# Patient Record
Sex: Female | Born: 1945
Health system: Southern US, Community
[De-identification: ages and names within clinical notes are randomized; demographics above are authoritative.]

## PROBLEM LIST (undated history)

## (undated) DIAGNOSIS — K219 Gastro-esophageal reflux disease without esophagitis: Secondary | ICD-10-CM

## (undated) DIAGNOSIS — F419 Anxiety disorder, unspecified: Secondary | ICD-10-CM

## (undated) HISTORY — PX: COLONOSCOPY: SHX174

## (undated) HISTORY — PX: ESOPHAGOGASTRODUODENOSCOPY: SHX1529

## (undated) HISTORY — PX: TUBAL LIGATION: SHX77

---

## 1998-07-19 ENCOUNTER — Other Ambulatory Visit: Admission: RE | Admit: 1998-07-19 | Discharge: 1998-07-19 | Payer: Self-pay | Admitting: Gastroenterology

## 2008-06-12 ENCOUNTER — Encounter (INDEPENDENT_AMBULATORY_CARE_PROVIDER_SITE_OTHER): Payer: Self-pay | Admitting: *Deleted

## 2009-03-21 ENCOUNTER — Telehealth: Payer: Self-pay | Admitting: Gastroenterology

## 2010-04-08 NOTE — Progress Notes (Signed)
Summary: Schedule Colonoscopy  Phone Note Outgoing Call Call back at [facility] Phone [phone]   Call placed by: Harlow Mares CMA Duncan Dull),  March 21, 2009 1:23 PM Call placed to: Patient Summary of Call: patient has no health insurance and is living off of her SS at this time. So she declined. I gave her the phone number for patient assistance [phone]. Also l gave her our number to call back if her status changes.  Initial call taken by: Harlow Mares CMA (AAMA),  March 21, 2009 1:35 PM

## 2012-01-22 ENCOUNTER — Encounter (INDEPENDENT_AMBULATORY_CARE_PROVIDER_SITE_OTHER): Payer: Self-pay | Admitting: *Deleted

## 2012-06-17 ENCOUNTER — Other Ambulatory Visit (HOSPITAL_COMMUNITY): Payer: Self-pay | Admitting: Internal Medicine

## 2012-06-17 DIAGNOSIS — Z139 Encounter for screening, unspecified: Secondary | ICD-10-CM

## 2012-06-21 ENCOUNTER — Ambulatory Visit (HOSPITAL_COMMUNITY)
Admission: RE | Admit: 2012-06-21 | Discharge: 2012-06-21 | Disposition: A | Discharge status: Home / Self Care | Payer: Medicare Other | Source: Ambulatory Visit | Attending: Internal Medicine | Admitting: Internal Medicine

## 2012-06-21 DIAGNOSIS — Z139 Encounter for screening, unspecified: Secondary | ICD-10-CM

## 2012-06-21 DIAGNOSIS — Z1231 Encounter for screening mammogram for malignant neoplasm of breast: Secondary | ICD-10-CM | POA: Insufficient documentation

## 2013-04-20 ENCOUNTER — Encounter (INDEPENDENT_AMBULATORY_CARE_PROVIDER_SITE_OTHER): Payer: Self-pay | Admitting: *Deleted

## 2013-08-22 ENCOUNTER — Other Ambulatory Visit (HOSPITAL_COMMUNITY): Payer: Self-pay | Admitting: Internal Medicine

## 2013-08-22 DIAGNOSIS — Z1231 Encounter for screening mammogram for malignant neoplasm of breast: Secondary | ICD-10-CM

## 2013-08-29 ENCOUNTER — Ambulatory Visit (HOSPITAL_COMMUNITY): Payer: Medicare Other

## 2013-08-31 ENCOUNTER — Ambulatory Visit (HOSPITAL_COMMUNITY)
Admission: RE | Admit: 2013-08-31 | Discharge: 2013-08-31 | Disposition: A | Discharge status: Home / Self Care | Payer: Medicare HMO | Source: Ambulatory Visit | Attending: Internal Medicine | Admitting: Internal Medicine

## 2013-08-31 DIAGNOSIS — Z1231 Encounter for screening mammogram for malignant neoplasm of breast: Secondary | ICD-10-CM | POA: Insufficient documentation

## 2014-10-09 DIAGNOSIS — I10 Essential (primary) hypertension: Secondary | ICD-10-CM | POA: Diagnosis not present

## 2014-10-09 DIAGNOSIS — Z0000 Encounter for general adult medical examination without abnormal findings: Secondary | ICD-10-CM | POA: Diagnosis not present

## 2014-10-09 DIAGNOSIS — E782 Mixed hyperlipidemia: Secondary | ICD-10-CM | POA: Diagnosis not present

## 2014-11-03 DIAGNOSIS — Z0000 Encounter for general adult medical examination without abnormal findings: Secondary | ICD-10-CM | POA: Diagnosis not present

## 2014-11-16 ENCOUNTER — Other Ambulatory Visit (HOSPITAL_COMMUNITY): Payer: Self-pay | Admitting: Internal Medicine

## 2014-11-16 DIAGNOSIS — Z1231 Encounter for screening mammogram for malignant neoplasm of breast: Secondary | ICD-10-CM

## 2014-11-21 ENCOUNTER — Other Ambulatory Visit (HOSPITAL_COMMUNITY): Payer: Self-pay | Admitting: Internal Medicine

## 2014-11-21 DIAGNOSIS — M8580 Other specified disorders of bone density and structure, unspecified site: Secondary | ICD-10-CM

## 2014-12-05 ENCOUNTER — Other Ambulatory Visit (HOSPITAL_COMMUNITY): Payer: Medicare HMO

## 2014-12-05 ENCOUNTER — Ambulatory Visit (HOSPITAL_COMMUNITY): Payer: Medicare HMO

## 2014-12-19 ENCOUNTER — Other Ambulatory Visit (HOSPITAL_COMMUNITY): Payer: Medicare HMO

## 2014-12-19 ENCOUNTER — Ambulatory Visit (HOSPITAL_COMMUNITY)
Admission: RE | Admit: 2014-12-19 | Discharge: 2014-12-19 | Disposition: A | Discharge status: Home / Self Care | Payer: Commercial Managed Care - HMO | Source: Ambulatory Visit | Attending: Internal Medicine | Admitting: Internal Medicine

## 2014-12-19 DIAGNOSIS — Z1231 Encounter for screening mammogram for malignant neoplasm of breast: Secondary | ICD-10-CM

## 2014-12-26 ENCOUNTER — Other Ambulatory Visit (HOSPITAL_COMMUNITY): Payer: Self-pay | Admitting: Internal Medicine

## 2014-12-26 ENCOUNTER — Ambulatory Visit (HOSPITAL_COMMUNITY)
Admission: RE | Admit: 2014-12-26 | Discharge: 2014-12-26 | Disposition: A | Discharge status: Home / Self Care | Payer: Commercial Managed Care - HMO | Source: Ambulatory Visit | Attending: Internal Medicine | Admitting: Internal Medicine

## 2014-12-26 DIAGNOSIS — Z780 Asymptomatic menopausal state: Secondary | ICD-10-CM | POA: Insufficient documentation

## 2014-12-26 DIAGNOSIS — M899 Disorder of bone, unspecified: Secondary | ICD-10-CM | POA: Insufficient documentation

## 2014-12-26 DIAGNOSIS — M8580 Other specified disorders of bone density and structure, unspecified site: Secondary | ICD-10-CM

## 2014-12-26 DIAGNOSIS — M810 Age-related osteoporosis without current pathological fracture: Secondary | ICD-10-CM | POA: Insufficient documentation

## 2015-01-29 DIAGNOSIS — F411 Generalized anxiety disorder: Secondary | ICD-10-CM | POA: Diagnosis not present

## 2015-01-29 DIAGNOSIS — C4441 Basal cell carcinoma of skin of scalp and neck: Secondary | ICD-10-CM | POA: Diagnosis not present

## 2015-02-13 DIAGNOSIS — Z85828 Personal history of other malignant neoplasm of skin: Secondary | ICD-10-CM | POA: Diagnosis not present

## 2015-02-13 DIAGNOSIS — D485 Neoplasm of uncertain behavior of skin: Secondary | ICD-10-CM | POA: Diagnosis not present

## 2015-02-13 DIAGNOSIS — L570 Actinic keratosis: Secondary | ICD-10-CM | POA: Diagnosis not present

## 2015-02-13 DIAGNOSIS — C4441 Basal cell carcinoma of skin of scalp and neck: Secondary | ICD-10-CM | POA: Diagnosis not present

## 2015-02-13 DIAGNOSIS — L821 Other seborrheic keratosis: Secondary | ICD-10-CM | POA: Diagnosis not present

## 2015-03-07 DIAGNOSIS — C4441 Basal cell carcinoma of skin of scalp and neck: Secondary | ICD-10-CM | POA: Diagnosis not present

## 2015-03-21 DIAGNOSIS — C4441 Basal cell carcinoma of skin of scalp and neck: Secondary | ICD-10-CM | POA: Diagnosis not present

## 2015-07-31 DIAGNOSIS — L570 Actinic keratosis: Secondary | ICD-10-CM | POA: Diagnosis not present

## 2016-02-06 DIAGNOSIS — E782 Mixed hyperlipidemia: Secondary | ICD-10-CM | POA: Diagnosis not present

## 2016-02-06 DIAGNOSIS — I10 Essential (primary) hypertension: Secondary | ICD-10-CM | POA: Diagnosis not present

## 2016-02-08 DIAGNOSIS — Z681 Body mass index (BMI) 19 or less, adult: Secondary | ICD-10-CM | POA: Diagnosis not present

## 2016-02-08 DIAGNOSIS — E785 Hyperlipidemia, unspecified: Secondary | ICD-10-CM | POA: Diagnosis not present

## 2016-02-08 DIAGNOSIS — Z0001 Encounter for general adult medical examination with abnormal findings: Secondary | ICD-10-CM | POA: Diagnosis not present

## 2016-02-20 ENCOUNTER — Other Ambulatory Visit (HOSPITAL_COMMUNITY): Payer: Self-pay | Admitting: Internal Medicine

## 2016-02-20 DIAGNOSIS — Z1231 Encounter for screening mammogram for malignant neoplasm of breast: Secondary | ICD-10-CM

## 2016-02-27 ENCOUNTER — Ambulatory Visit (HOSPITAL_COMMUNITY)
Admission: RE | Admit: 2016-02-27 | Discharge: 2016-02-27 | Disposition: A | Discharge status: Home / Self Care | Payer: Commercial Managed Care - HMO | Source: Ambulatory Visit | Attending: Internal Medicine | Admitting: Internal Medicine

## 2016-02-27 DIAGNOSIS — Z1231 Encounter for screening mammogram for malignant neoplasm of breast: Secondary | ICD-10-CM | POA: Diagnosis not present

## 2017-02-06 DIAGNOSIS — I10 Essential (primary) hypertension: Secondary | ICD-10-CM | POA: Diagnosis not present

## 2017-02-06 DIAGNOSIS — E782 Mixed hyperlipidemia: Secondary | ICD-10-CM | POA: Diagnosis not present

## 2017-02-17 DIAGNOSIS — Z2821 Immunization not carried out because of patient refusal: Secondary | ICD-10-CM | POA: Diagnosis not present

## 2017-02-17 DIAGNOSIS — E782 Mixed hyperlipidemia: Secondary | ICD-10-CM | POA: Diagnosis not present

## 2017-02-17 DIAGNOSIS — K219 Gastro-esophageal reflux disease without esophagitis: Secondary | ICD-10-CM | POA: Diagnosis not present

## 2017-02-17 DIAGNOSIS — Z0000 Encounter for general adult medical examination without abnormal findings: Secondary | ICD-10-CM | POA: Diagnosis not present

## 2017-02-19 ENCOUNTER — Other Ambulatory Visit (HOSPITAL_COMMUNITY): Payer: Self-pay | Admitting: Internal Medicine

## 2017-02-19 DIAGNOSIS — Z1231 Encounter for screening mammogram for malignant neoplasm of breast: Secondary | ICD-10-CM

## 2017-02-19 DIAGNOSIS — Z780 Asymptomatic menopausal state: Secondary | ICD-10-CM

## 2017-03-03 ENCOUNTER — Ambulatory Visit (HOSPITAL_COMMUNITY)
Admission: RE | Admit: 2017-03-03 | Discharge: 2017-03-03 | Disposition: A | Discharge status: Home / Self Care | Payer: Medicare HMO | Source: Ambulatory Visit | Attending: Internal Medicine | Admitting: Internal Medicine

## 2017-03-03 DIAGNOSIS — M810 Age-related osteoporosis without current pathological fracture: Secondary | ICD-10-CM | POA: Insufficient documentation

## 2017-03-03 DIAGNOSIS — Z1231 Encounter for screening mammogram for malignant neoplasm of breast: Secondary | ICD-10-CM | POA: Insufficient documentation

## 2017-03-03 DIAGNOSIS — Z780 Asymptomatic menopausal state: Secondary | ICD-10-CM

## 2017-03-17 DIAGNOSIS — Z712 Person consulting for explanation of examination or test findings: Secondary | ICD-10-CM | POA: Diagnosis not present

## 2017-03-17 DIAGNOSIS — M810 Age-related osteoporosis without current pathological fracture: Secondary | ICD-10-CM | POA: Diagnosis not present

## 2017-03-24 DIAGNOSIS — X32XXXD Exposure to sunlight, subsequent encounter: Secondary | ICD-10-CM | POA: Diagnosis not present

## 2017-03-24 DIAGNOSIS — L570 Actinic keratosis: Secondary | ICD-10-CM | POA: Diagnosis not present

## 2017-03-24 DIAGNOSIS — L821 Other seborrheic keratosis: Secondary | ICD-10-CM | POA: Diagnosis not present

## 2018-02-23 DIAGNOSIS — E782 Mixed hyperlipidemia: Secondary | ICD-10-CM | POA: Diagnosis not present

## 2018-02-23 DIAGNOSIS — M810 Age-related osteoporosis without current pathological fracture: Secondary | ICD-10-CM | POA: Diagnosis not present

## 2018-02-28 DIAGNOSIS — Z0000 Encounter for general adult medical examination without abnormal findings: Secondary | ICD-10-CM | POA: Diagnosis not present

## 2018-02-28 DIAGNOSIS — N939 Abnormal uterine and vaginal bleeding, unspecified: Secondary | ICD-10-CM | POA: Diagnosis not present

## 2018-02-28 DIAGNOSIS — E782 Mixed hyperlipidemia: Secondary | ICD-10-CM | POA: Diagnosis not present

## 2018-02-28 DIAGNOSIS — K219 Gastro-esophageal reflux disease without esophagitis: Secondary | ICD-10-CM | POA: Diagnosis not present

## 2018-03-21 DIAGNOSIS — Z2821 Immunization not carried out because of patient refusal: Secondary | ICD-10-CM | POA: Diagnosis not present

## 2018-03-21 DIAGNOSIS — Z712 Person consulting for explanation of examination or test findings: Secondary | ICD-10-CM | POA: Diagnosis not present

## 2018-03-21 DIAGNOSIS — Z0000 Encounter for general adult medical examination without abnormal findings: Secondary | ICD-10-CM | POA: Diagnosis not present

## 2018-03-21 DIAGNOSIS — M810 Age-related osteoporosis without current pathological fracture: Secondary | ICD-10-CM | POA: Diagnosis not present

## 2018-03-21 DIAGNOSIS — N952 Postmenopausal atrophic vaginitis: Secondary | ICD-10-CM | POA: Diagnosis not present

## 2018-03-21 DIAGNOSIS — N950 Postmenopausal bleeding: Secondary | ICD-10-CM | POA: Diagnosis not present

## 2018-03-21 DIAGNOSIS — E782 Mixed hyperlipidemia: Secondary | ICD-10-CM | POA: Diagnosis not present

## 2018-03-21 DIAGNOSIS — N939 Abnormal uterine and vaginal bleeding, unspecified: Secondary | ICD-10-CM | POA: Diagnosis not present

## 2018-03-21 DIAGNOSIS — K219 Gastro-esophageal reflux disease without esophagitis: Secondary | ICD-10-CM | POA: Diagnosis not present

## 2018-04-07 DIAGNOSIS — H00022 Hordeolum internum right lower eyelid: Secondary | ICD-10-CM | POA: Diagnosis not present

## 2018-07-06 DIAGNOSIS — Z0000 Encounter for general adult medical examination without abnormal findings: Secondary | ICD-10-CM | POA: Diagnosis not present

## 2018-09-26 DIAGNOSIS — D044 Carcinoma in situ of skin of scalp and neck: Secondary | ICD-10-CM | POA: Diagnosis not present

## 2018-09-26 DIAGNOSIS — D045 Carcinoma in situ of skin of trunk: Secondary | ICD-10-CM | POA: Diagnosis not present

## 2018-09-26 DIAGNOSIS — C44319 Basal cell carcinoma of skin of other parts of face: Secondary | ICD-10-CM | POA: Diagnosis not present

## 2018-09-26 DIAGNOSIS — D225 Melanocytic nevi of trunk: Secondary | ICD-10-CM | POA: Diagnosis not present

## 2018-09-26 DIAGNOSIS — Z1283 Encounter for screening for malignant neoplasm of skin: Secondary | ICD-10-CM | POA: Diagnosis not present

## 2018-10-24 DIAGNOSIS — L570 Actinic keratosis: Secondary | ICD-10-CM | POA: Diagnosis not present

## 2018-10-24 DIAGNOSIS — D045 Carcinoma in situ of skin of trunk: Secondary | ICD-10-CM | POA: Diagnosis not present

## 2018-10-24 DIAGNOSIS — Z85828 Personal history of other malignant neoplasm of skin: Secondary | ICD-10-CM | POA: Diagnosis not present

## 2018-10-24 DIAGNOSIS — D044 Carcinoma in situ of skin of scalp and neck: Secondary | ICD-10-CM | POA: Diagnosis not present

## 2018-10-24 DIAGNOSIS — L821 Other seborrheic keratosis: Secondary | ICD-10-CM | POA: Diagnosis not present

## 2018-10-24 DIAGNOSIS — Z08 Encounter for follow-up examination after completed treatment for malignant neoplasm: Secondary | ICD-10-CM | POA: Diagnosis not present

## 2018-10-24 DIAGNOSIS — X32XXXD Exposure to sunlight, subsequent encounter: Secondary | ICD-10-CM | POA: Diagnosis not present

## 2018-11-09 DIAGNOSIS — L570 Actinic keratosis: Secondary | ICD-10-CM | POA: Diagnosis not present

## 2018-11-09 DIAGNOSIS — X32XXXD Exposure to sunlight, subsequent encounter: Secondary | ICD-10-CM | POA: Diagnosis not present

## 2018-11-09 DIAGNOSIS — D044 Carcinoma in situ of skin of scalp and neck: Secondary | ICD-10-CM | POA: Diagnosis not present

## 2019-01-11 DIAGNOSIS — Z85828 Personal history of other malignant neoplasm of skin: Secondary | ICD-10-CM | POA: Diagnosis not present

## 2019-01-11 DIAGNOSIS — Z08 Encounter for follow-up examination after completed treatment for malignant neoplasm: Secondary | ICD-10-CM | POA: Diagnosis not present

## 2019-01-11 DIAGNOSIS — L928 Other granulomatous disorders of the skin and subcutaneous tissue: Secondary | ICD-10-CM | POA: Diagnosis not present

## 2019-01-27 DIAGNOSIS — E782 Mixed hyperlipidemia: Secondary | ICD-10-CM | POA: Diagnosis not present

## 2019-01-27 DIAGNOSIS — K219 Gastro-esophageal reflux disease without esophagitis: Secondary | ICD-10-CM | POA: Diagnosis not present

## 2019-02-28 DIAGNOSIS — L570 Actinic keratosis: Secondary | ICD-10-CM | POA: Diagnosis not present

## 2019-02-28 DIAGNOSIS — Z85828 Personal history of other malignant neoplasm of skin: Secondary | ICD-10-CM | POA: Diagnosis not present

## 2019-02-28 DIAGNOSIS — X32XXXD Exposure to sunlight, subsequent encounter: Secondary | ICD-10-CM | POA: Diagnosis not present

## 2019-02-28 DIAGNOSIS — Z08 Encounter for follow-up examination after completed treatment for malignant neoplasm: Secondary | ICD-10-CM | POA: Diagnosis not present

## 2019-03-06 DIAGNOSIS — K219 Gastro-esophageal reflux disease without esophagitis: Secondary | ICD-10-CM | POA: Diagnosis not present

## 2019-03-06 DIAGNOSIS — M810 Age-related osteoporosis without current pathological fracture: Secondary | ICD-10-CM | POA: Diagnosis not present

## 2019-03-06 DIAGNOSIS — E782 Mixed hyperlipidemia: Secondary | ICD-10-CM | POA: Diagnosis not present

## 2019-03-30 DIAGNOSIS — N950 Postmenopausal bleeding: Secondary | ICD-10-CM | POA: Diagnosis not present

## 2019-03-30 DIAGNOSIS — E782 Mixed hyperlipidemia: Secondary | ICD-10-CM | POA: Diagnosis not present

## 2019-03-30 DIAGNOSIS — M810 Age-related osteoporosis without current pathological fracture: Secondary | ICD-10-CM | POA: Diagnosis not present

## 2019-03-30 DIAGNOSIS — Z1329 Encounter for screening for other suspected endocrine disorder: Secondary | ICD-10-CM | POA: Diagnosis not present

## 2019-04-03 DIAGNOSIS — E559 Vitamin D deficiency, unspecified: Secondary | ICD-10-CM | POA: Diagnosis not present

## 2019-04-03 DIAGNOSIS — K219 Gastro-esophageal reflux disease without esophagitis: Secondary | ICD-10-CM | POA: Diagnosis not present

## 2019-04-03 DIAGNOSIS — E782 Mixed hyperlipidemia: Secondary | ICD-10-CM | POA: Diagnosis not present

## 2019-04-03 DIAGNOSIS — Z20828 Contact with and (suspected) exposure to other viral communicable diseases: Secondary | ICD-10-CM | POA: Diagnosis not present

## 2019-04-11 DIAGNOSIS — X32XXXD Exposure to sunlight, subsequent encounter: Secondary | ICD-10-CM | POA: Diagnosis not present

## 2019-04-11 DIAGNOSIS — D044 Carcinoma in situ of skin of scalp and neck: Secondary | ICD-10-CM | POA: Diagnosis not present

## 2019-04-11 DIAGNOSIS — L570 Actinic keratosis: Secondary | ICD-10-CM | POA: Diagnosis not present

## 2019-04-14 DIAGNOSIS — M810 Age-related osteoporosis without current pathological fracture: Secondary | ICD-10-CM | POA: Diagnosis not present

## 2019-04-14 DIAGNOSIS — E7849 Other hyperlipidemia: Secondary | ICD-10-CM | POA: Diagnosis not present

## 2019-04-14 DIAGNOSIS — K219 Gastro-esophageal reflux disease without esophagitis: Secondary | ICD-10-CM | POA: Diagnosis not present

## 2019-04-14 DIAGNOSIS — E782 Mixed hyperlipidemia: Secondary | ICD-10-CM | POA: Diagnosis not present

## 2019-05-23 DIAGNOSIS — X32XXXD Exposure to sunlight, subsequent encounter: Secondary | ICD-10-CM | POA: Diagnosis not present

## 2019-05-23 DIAGNOSIS — L570 Actinic keratosis: Secondary | ICD-10-CM | POA: Diagnosis not present

## 2019-05-23 DIAGNOSIS — D044 Carcinoma in situ of skin of scalp and neck: Secondary | ICD-10-CM | POA: Diagnosis not present

## 2019-06-27 DIAGNOSIS — K219 Gastro-esophageal reflux disease without esophagitis: Secondary | ICD-10-CM | POA: Diagnosis not present

## 2019-06-27 DIAGNOSIS — E7849 Other hyperlipidemia: Secondary | ICD-10-CM | POA: Diagnosis not present

## 2019-06-27 DIAGNOSIS — E782 Mixed hyperlipidemia: Secondary | ICD-10-CM | POA: Diagnosis not present

## 2019-06-27 DIAGNOSIS — M810 Age-related osteoporosis without current pathological fracture: Secondary | ICD-10-CM | POA: Diagnosis not present

## 2019-07-04 DIAGNOSIS — L905 Scar conditions and fibrosis of skin: Secondary | ICD-10-CM | POA: Diagnosis not present

## 2019-07-04 DIAGNOSIS — L570 Actinic keratosis: Secondary | ICD-10-CM | POA: Diagnosis not present

## 2019-07-04 DIAGNOSIS — X32XXXD Exposure to sunlight, subsequent encounter: Secondary | ICD-10-CM | POA: Diagnosis not present

## 2019-08-15 DIAGNOSIS — L570 Actinic keratosis: Secondary | ICD-10-CM | POA: Diagnosis not present

## 2019-08-15 DIAGNOSIS — C44612 Basal cell carcinoma of skin of right upper limb, including shoulder: Secondary | ICD-10-CM | POA: Diagnosis not present

## 2019-08-15 DIAGNOSIS — X32XXXD Exposure to sunlight, subsequent encounter: Secondary | ICD-10-CM | POA: Diagnosis not present

## 2019-09-12 DIAGNOSIS — Z85828 Personal history of other malignant neoplasm of skin: Secondary | ICD-10-CM | POA: Diagnosis not present

## 2019-09-12 DIAGNOSIS — L980 Pyogenic granuloma: Secondary | ICD-10-CM | POA: Diagnosis not present

## 2019-09-12 DIAGNOSIS — Z08 Encounter for follow-up examination after completed treatment for malignant neoplasm: Secondary | ICD-10-CM | POA: Diagnosis not present

## 2019-09-28 DIAGNOSIS — H01005 Unspecified blepharitis left lower eyelid: Secondary | ICD-10-CM | POA: Diagnosis not present

## 2019-09-28 DIAGNOSIS — H01004 Unspecified blepharitis left upper eyelid: Secondary | ICD-10-CM | POA: Diagnosis not present

## 2019-09-28 DIAGNOSIS — H01002 Unspecified blepharitis right lower eyelid: Secondary | ICD-10-CM | POA: Diagnosis not present

## 2019-09-28 DIAGNOSIS — H01001 Unspecified blepharitis right upper eyelid: Secondary | ICD-10-CM | POA: Diagnosis not present

## 2019-10-10 DIAGNOSIS — L980 Pyogenic granuloma: Secondary | ICD-10-CM | POA: Diagnosis not present

## 2019-10-13 DIAGNOSIS — Z0000 Encounter for general adult medical examination without abnormal findings: Secondary | ICD-10-CM | POA: Diagnosis not present

## 2019-10-13 DIAGNOSIS — M810 Age-related osteoporosis without current pathological fracture: Secondary | ICD-10-CM | POA: Diagnosis not present

## 2019-10-13 DIAGNOSIS — N939 Abnormal uterine and vaginal bleeding, unspecified: Secondary | ICD-10-CM | POA: Diagnosis not present

## 2019-10-13 DIAGNOSIS — K219 Gastro-esophageal reflux disease without esophagitis: Secondary | ICD-10-CM | POA: Diagnosis not present

## 2019-10-13 DIAGNOSIS — E782 Mixed hyperlipidemia: Secondary | ICD-10-CM | POA: Diagnosis not present

## 2019-10-13 DIAGNOSIS — E7849 Other hyperlipidemia: Secondary | ICD-10-CM | POA: Diagnosis not present

## 2019-10-13 DIAGNOSIS — N950 Postmenopausal bleeding: Secondary | ICD-10-CM | POA: Diagnosis not present

## 2019-10-13 DIAGNOSIS — Z2821 Immunization not carried out because of patient refusal: Secondary | ICD-10-CM | POA: Diagnosis not present

## 2019-10-13 DIAGNOSIS — Z20828 Contact with and (suspected) exposure to other viral communicable diseases: Secondary | ICD-10-CM | POA: Diagnosis not present

## 2019-10-19 DIAGNOSIS — E782 Mixed hyperlipidemia: Secondary | ICD-10-CM | POA: Diagnosis not present

## 2019-10-19 DIAGNOSIS — E7849 Other hyperlipidemia: Secondary | ICD-10-CM | POA: Diagnosis not present

## 2019-10-19 DIAGNOSIS — E559 Vitamin D deficiency, unspecified: Secondary | ICD-10-CM | POA: Diagnosis not present

## 2019-10-19 DIAGNOSIS — Z0000 Encounter for general adult medical examination without abnormal findings: Secondary | ICD-10-CM | POA: Diagnosis not present

## 2019-10-19 DIAGNOSIS — M810 Age-related osteoporosis without current pathological fracture: Secondary | ICD-10-CM | POA: Diagnosis not present

## 2019-10-19 DIAGNOSIS — K219 Gastro-esophageal reflux disease without esophagitis: Secondary | ICD-10-CM | POA: Diagnosis not present

## 2019-10-19 DIAGNOSIS — N950 Postmenopausal bleeding: Secondary | ICD-10-CM | POA: Diagnosis not present

## 2019-10-19 DIAGNOSIS — N939 Abnormal uterine and vaginal bleeding, unspecified: Secondary | ICD-10-CM | POA: Diagnosis not present

## 2019-10-19 DIAGNOSIS — Z0001 Encounter for general adult medical examination with abnormal findings: Secondary | ICD-10-CM | POA: Diagnosis not present

## 2019-10-19 DIAGNOSIS — Z20828 Contact with and (suspected) exposure to other viral communicable diseases: Secondary | ICD-10-CM | POA: Diagnosis not present

## 2019-10-19 DIAGNOSIS — Z2821 Immunization not carried out because of patient refusal: Secondary | ICD-10-CM | POA: Diagnosis not present

## 2019-10-20 DIAGNOSIS — K219 Gastro-esophageal reflux disease without esophagitis: Secondary | ICD-10-CM | POA: Diagnosis not present

## 2019-10-20 DIAGNOSIS — E7849 Other hyperlipidemia: Secondary | ICD-10-CM | POA: Diagnosis not present

## 2019-10-20 DIAGNOSIS — M810 Age-related osteoporosis without current pathological fracture: Secondary | ICD-10-CM | POA: Diagnosis not present

## 2019-11-07 DIAGNOSIS — C441192 Basal cell carcinoma of skin of left lower eyelid, including canthus: Secondary | ICD-10-CM | POA: Diagnosis not present

## 2019-11-07 DIAGNOSIS — C441122 Basal cell carcinoma of skin of right lower eyelid, including canthus: Secondary | ICD-10-CM | POA: Diagnosis not present

## 2019-11-09 ENCOUNTER — Other Ambulatory Visit (HOSPITAL_COMMUNITY): Payer: Self-pay | Admitting: Internal Medicine

## 2019-11-09 DIAGNOSIS — Z1231 Encounter for screening mammogram for malignant neoplasm of breast: Secondary | ICD-10-CM

## 2019-11-09 DIAGNOSIS — Z13820 Encounter for screening for osteoporosis: Secondary | ICD-10-CM

## 2019-11-14 DIAGNOSIS — L980 Pyogenic granuloma: Secondary | ICD-10-CM | POA: Diagnosis not present

## 2019-11-22 ENCOUNTER — Other Ambulatory Visit (HOSPITAL_COMMUNITY): Payer: Commercial Managed Care - HMO

## 2019-11-22 ENCOUNTER — Ambulatory Visit (HOSPITAL_COMMUNITY): Payer: Commercial Managed Care - HMO

## 2019-11-23 ENCOUNTER — Other Ambulatory Visit (HOSPITAL_COMMUNITY): Payer: Commercial Managed Care - HMO

## 2019-11-23 ENCOUNTER — Ambulatory Visit (HOSPITAL_COMMUNITY)
Admission: RE | Admit: 2019-11-23 | Discharge: 2019-11-23 | Disposition: A | Discharge status: Home / Self Care | Payer: Medicare HMO | Source: Ambulatory Visit | Attending: Internal Medicine | Admitting: Internal Medicine

## 2019-11-23 ENCOUNTER — Other Ambulatory Visit: Payer: Self-pay

## 2019-11-23 DIAGNOSIS — Z1231 Encounter for screening mammogram for malignant neoplasm of breast: Secondary | ICD-10-CM

## 2019-11-29 ENCOUNTER — Other Ambulatory Visit (HOSPITAL_COMMUNITY): Payer: Commercial Managed Care - HMO

## 2019-11-29 ENCOUNTER — Ambulatory Visit (HOSPITAL_COMMUNITY): Payer: Commercial Managed Care - HMO

## 2019-11-30 DIAGNOSIS — S91031A Puncture wound without foreign body, right ankle, initial encounter: Secondary | ICD-10-CM | POA: Diagnosis not present

## 2019-11-30 DIAGNOSIS — S91051A Open bite, right ankle, initial encounter: Secondary | ICD-10-CM | POA: Diagnosis not present

## 2019-11-30 DIAGNOSIS — Z2914 Encounter for prophylactic rabies immune globin: Secondary | ICD-10-CM | POA: Diagnosis not present

## 2019-11-30 DIAGNOSIS — Z23 Encounter for immunization: Secondary | ICD-10-CM | POA: Diagnosis not present

## 2019-11-30 DIAGNOSIS — Z203 Contact with and (suspected) exposure to rabies: Secondary | ICD-10-CM | POA: Diagnosis not present

## 2019-12-03 DIAGNOSIS — Z203 Contact with and (suspected) exposure to rabies: Secondary | ICD-10-CM | POA: Diagnosis not present

## 2019-12-03 DIAGNOSIS — Z23 Encounter for immunization: Secondary | ICD-10-CM | POA: Diagnosis not present

## 2019-12-03 DIAGNOSIS — Z2914 Encounter for prophylactic rabies immune globin: Secondary | ICD-10-CM | POA: Diagnosis not present

## 2019-12-07 DIAGNOSIS — Z203 Contact with and (suspected) exposure to rabies: Secondary | ICD-10-CM | POA: Diagnosis not present

## 2019-12-07 DIAGNOSIS — Z23 Encounter for immunization: Secondary | ICD-10-CM | POA: Diagnosis not present

## 2019-12-08 DIAGNOSIS — C441192 Basal cell carcinoma of skin of left lower eyelid, including canthus: Secondary | ICD-10-CM | POA: Diagnosis not present

## 2019-12-08 DIAGNOSIS — R6889 Other general symptoms and signs: Secondary | ICD-10-CM | POA: Diagnosis not present

## 2019-12-14 DIAGNOSIS — Z203 Contact with and (suspected) exposure to rabies: Secondary | ICD-10-CM | POA: Diagnosis not present

## 2019-12-14 DIAGNOSIS — Z2914 Encounter for prophylactic rabies immune globin: Secondary | ICD-10-CM | POA: Diagnosis not present

## 2019-12-20 DIAGNOSIS — C441192 Basal cell carcinoma of skin of left lower eyelid, including canthus: Secondary | ICD-10-CM | POA: Diagnosis not present

## 2019-12-20 DIAGNOSIS — H02422 Myogenic ptosis of left eyelid: Secondary | ICD-10-CM | POA: Diagnosis not present

## 2019-12-20 DIAGNOSIS — C441121 Basal cell carcinoma of skin of right upper eyelid, including canthus: Secondary | ICD-10-CM | POA: Diagnosis not present

## 2019-12-20 DIAGNOSIS — S01412S Laceration without foreign body of left cheek and temporomandibular area, sequela: Secondary | ICD-10-CM | POA: Diagnosis not present

## 2019-12-20 DIAGNOSIS — S0181XS Laceration without foreign body of other part of head, sequela: Secondary | ICD-10-CM | POA: Diagnosis not present

## 2019-12-20 DIAGNOSIS — H2513 Age-related nuclear cataract, bilateral: Secondary | ICD-10-CM | POA: Diagnosis not present

## 2019-12-20 DIAGNOSIS — C441191 Basal cell carcinoma of skin of left upper eyelid, including canthus: Secondary | ICD-10-CM | POA: Diagnosis not present

## 2019-12-20 DIAGNOSIS — S01411S Laceration without foreign body of right cheek and temporomandibular area, sequela: Secondary | ICD-10-CM | POA: Diagnosis not present

## 2019-12-20 DIAGNOSIS — R6889 Other general symptoms and signs: Secondary | ICD-10-CM | POA: Diagnosis not present

## 2019-12-20 DIAGNOSIS — H527 Unspecified disorder of refraction: Secondary | ICD-10-CM | POA: Diagnosis not present

## 2019-12-26 DIAGNOSIS — L570 Actinic keratosis: Secondary | ICD-10-CM | POA: Diagnosis not present

## 2019-12-26 DIAGNOSIS — L980 Pyogenic granuloma: Secondary | ICD-10-CM | POA: Diagnosis not present

## 2019-12-26 DIAGNOSIS — X32XXXD Exposure to sunlight, subsequent encounter: Secondary | ICD-10-CM | POA: Diagnosis not present

## 2020-01-05 DIAGNOSIS — K219 Gastro-esophageal reflux disease without esophagitis: Secondary | ICD-10-CM | POA: Diagnosis not present

## 2020-01-05 DIAGNOSIS — E782 Mixed hyperlipidemia: Secondary | ICD-10-CM | POA: Diagnosis not present

## 2020-01-23 DIAGNOSIS — R6889 Other general symptoms and signs: Secondary | ICD-10-CM | POA: Diagnosis not present

## 2020-01-23 DIAGNOSIS — C441192 Basal cell carcinoma of skin of left lower eyelid, including canthus: Secondary | ICD-10-CM | POA: Diagnosis not present

## 2020-01-24 DIAGNOSIS — H2513 Age-related nuclear cataract, bilateral: Secondary | ICD-10-CM | POA: Diagnosis not present

## 2020-01-24 DIAGNOSIS — S01411S Laceration without foreign body of right cheek and temporomandibular area, sequela: Secondary | ICD-10-CM | POA: Diagnosis not present

## 2020-01-24 DIAGNOSIS — C441192 Basal cell carcinoma of skin of left lower eyelid, including canthus: Secondary | ICD-10-CM | POA: Diagnosis not present

## 2020-01-24 DIAGNOSIS — C441121 Basal cell carcinoma of skin of right upper eyelid, including canthus: Secondary | ICD-10-CM | POA: Diagnosis not present

## 2020-01-24 DIAGNOSIS — S01112S Laceration without foreign body of left eyelid and periocular area, sequela: Secondary | ICD-10-CM | POA: Diagnosis not present

## 2020-01-24 DIAGNOSIS — S01412S Laceration without foreign body of left cheek and temporomandibular area, sequela: Secondary | ICD-10-CM | POA: Diagnosis not present

## 2020-01-24 DIAGNOSIS — R6889 Other general symptoms and signs: Secondary | ICD-10-CM | POA: Diagnosis not present

## 2020-01-24 DIAGNOSIS — S0181XS Laceration without foreign body of other part of head, sequela: Secondary | ICD-10-CM | POA: Diagnosis not present

## 2020-01-24 DIAGNOSIS — S01111S Laceration without foreign body of right eyelid and periocular area, sequela: Secondary | ICD-10-CM | POA: Diagnosis not present

## 2020-01-24 DIAGNOSIS — C441191 Basal cell carcinoma of skin of left upper eyelid, including canthus: Secondary | ICD-10-CM | POA: Diagnosis not present

## 2020-02-07 DIAGNOSIS — R6889 Other general symptoms and signs: Secondary | ICD-10-CM | POA: Diagnosis not present

## 2020-03-08 DIAGNOSIS — K219 Gastro-esophageal reflux disease without esophagitis: Secondary | ICD-10-CM | POA: Diagnosis not present

## 2020-03-08 DIAGNOSIS — E782 Mixed hyperlipidemia: Secondary | ICD-10-CM | POA: Diagnosis not present

## 2020-03-14 DIAGNOSIS — E559 Vitamin D deficiency, unspecified: Secondary | ICD-10-CM | POA: Diagnosis not present

## 2020-03-14 DIAGNOSIS — Z20828 Contact with and (suspected) exposure to other viral communicable diseases: Secondary | ICD-10-CM | POA: Diagnosis not present

## 2020-03-14 DIAGNOSIS — K219 Gastro-esophageal reflux disease without esophagitis: Secondary | ICD-10-CM | POA: Diagnosis not present

## 2020-03-14 DIAGNOSIS — E7849 Other hyperlipidemia: Secondary | ICD-10-CM | POA: Diagnosis not present

## 2020-03-14 DIAGNOSIS — Z0000 Encounter for general adult medical examination without abnormal findings: Secondary | ICD-10-CM | POA: Diagnosis not present

## 2020-03-14 DIAGNOSIS — E782 Mixed hyperlipidemia: Secondary | ICD-10-CM | POA: Diagnosis not present

## 2020-03-14 DIAGNOSIS — N950 Postmenopausal bleeding: Secondary | ICD-10-CM | POA: Diagnosis not present

## 2020-03-14 DIAGNOSIS — N939 Abnormal uterine and vaginal bleeding, unspecified: Secondary | ICD-10-CM | POA: Diagnosis not present

## 2020-03-14 DIAGNOSIS — Z0001 Encounter for general adult medical examination with abnormal findings: Secondary | ICD-10-CM | POA: Diagnosis not present

## 2020-03-14 DIAGNOSIS — Z2821 Immunization not carried out because of patient refusal: Secondary | ICD-10-CM | POA: Diagnosis not present

## 2020-03-21 DIAGNOSIS — E559 Vitamin D deficiency, unspecified: Secondary | ICD-10-CM | POA: Diagnosis not present

## 2020-03-21 DIAGNOSIS — K219 Gastro-esophageal reflux disease without esophagitis: Secondary | ICD-10-CM | POA: Diagnosis not present

## 2020-03-21 DIAGNOSIS — E782 Mixed hyperlipidemia: Secondary | ICD-10-CM | POA: Diagnosis not present

## 2020-03-21 DIAGNOSIS — C441191 Basal cell carcinoma of skin of left upper eyelid, including canthus: Secondary | ICD-10-CM | POA: Diagnosis not present

## 2020-03-21 DIAGNOSIS — M810 Age-related osteoporosis without current pathological fracture: Secondary | ICD-10-CM | POA: Diagnosis not present

## 2020-03-27 ENCOUNTER — Other Ambulatory Visit (HOSPITAL_COMMUNITY): Payer: Self-pay | Admitting: Internal Medicine

## 2020-03-27 DIAGNOSIS — Z13820 Encounter for screening for osteoporosis: Secondary | ICD-10-CM

## 2020-04-06 DIAGNOSIS — E782 Mixed hyperlipidemia: Secondary | ICD-10-CM | POA: Diagnosis not present

## 2020-04-06 DIAGNOSIS — K219 Gastro-esophageal reflux disease without esophagitis: Secondary | ICD-10-CM | POA: Diagnosis not present

## 2020-06-05 DIAGNOSIS — L570 Actinic keratosis: Secondary | ICD-10-CM | POA: Diagnosis not present

## 2020-06-05 DIAGNOSIS — E559 Vitamin D deficiency, unspecified: Secondary | ICD-10-CM | POA: Diagnosis not present

## 2020-06-05 DIAGNOSIS — K219 Gastro-esophageal reflux disease without esophagitis: Secondary | ICD-10-CM | POA: Diagnosis not present

## 2020-06-05 DIAGNOSIS — E782 Mixed hyperlipidemia: Secondary | ICD-10-CM | POA: Diagnosis not present

## 2020-06-05 DIAGNOSIS — C441191 Basal cell carcinoma of skin of left upper eyelid, including canthus: Secondary | ICD-10-CM | POA: Diagnosis not present

## 2020-06-05 DIAGNOSIS — X32XXXD Exposure to sunlight, subsequent encounter: Secondary | ICD-10-CM | POA: Diagnosis not present

## 2020-06-05 DIAGNOSIS — M810 Age-related osteoporosis without current pathological fracture: Secondary | ICD-10-CM | POA: Diagnosis not present

## 2020-06-06 ENCOUNTER — Other Ambulatory Visit: Payer: Self-pay

## 2020-06-06 ENCOUNTER — Ambulatory Visit (HOSPITAL_COMMUNITY)
Admission: RE | Admit: 2020-06-06 | Discharge: 2020-06-06 | Disposition: A | Discharge status: Home / Self Care | Payer: Medicare HMO | Source: Ambulatory Visit | Attending: Internal Medicine | Admitting: Internal Medicine

## 2020-06-06 DIAGNOSIS — M810 Age-related osteoporosis without current pathological fracture: Secondary | ICD-10-CM | POA: Diagnosis not present

## 2020-06-06 DIAGNOSIS — Z780 Asymptomatic menopausal state: Secondary | ICD-10-CM | POA: Diagnosis not present

## 2020-06-06 DIAGNOSIS — Z13820 Encounter for screening for osteoporosis: Secondary | ICD-10-CM | POA: Insufficient documentation

## 2020-06-24 DIAGNOSIS — M810 Age-related osteoporosis without current pathological fracture: Secondary | ICD-10-CM | POA: Diagnosis not present

## 2020-07-07 DIAGNOSIS — K219 Gastro-esophageal reflux disease without esophagitis: Secondary | ICD-10-CM | POA: Diagnosis not present

## 2020-07-07 DIAGNOSIS — E7849 Other hyperlipidemia: Secondary | ICD-10-CM | POA: Diagnosis not present

## 2020-09-05 DIAGNOSIS — E7849 Other hyperlipidemia: Secondary | ICD-10-CM | POA: Diagnosis not present

## 2020-09-05 DIAGNOSIS — K219 Gastro-esophageal reflux disease without esophagitis: Secondary | ICD-10-CM | POA: Diagnosis not present

## 2020-09-24 DIAGNOSIS — Z08 Encounter for follow-up examination after completed treatment for malignant neoplasm: Secondary | ICD-10-CM | POA: Diagnosis not present

## 2020-09-24 DIAGNOSIS — Z85828 Personal history of other malignant neoplasm of skin: Secondary | ICD-10-CM | POA: Diagnosis not present

## 2020-09-24 DIAGNOSIS — L570 Actinic keratosis: Secondary | ICD-10-CM | POA: Diagnosis not present

## 2020-09-24 DIAGNOSIS — X32XXXD Exposure to sunlight, subsequent encounter: Secondary | ICD-10-CM | POA: Diagnosis not present

## 2020-09-27 DIAGNOSIS — Z0000 Encounter for general adult medical examination without abnormal findings: Secondary | ICD-10-CM | POA: Diagnosis not present

## 2020-09-27 DIAGNOSIS — E559 Vitamin D deficiency, unspecified: Secondary | ICD-10-CM | POA: Diagnosis not present

## 2020-09-27 DIAGNOSIS — E782 Mixed hyperlipidemia: Secondary | ICD-10-CM | POA: Diagnosis not present

## 2020-10-01 DIAGNOSIS — E782 Mixed hyperlipidemia: Secondary | ICD-10-CM | POA: Diagnosis not present

## 2020-10-01 DIAGNOSIS — K219 Gastro-esophageal reflux disease without esophagitis: Secondary | ICD-10-CM | POA: Diagnosis not present

## 2020-10-01 DIAGNOSIS — M8580 Other specified disorders of bone density and structure, unspecified site: Secondary | ICD-10-CM | POA: Diagnosis not present

## 2020-10-01 DIAGNOSIS — E559 Vitamin D deficiency, unspecified: Secondary | ICD-10-CM | POA: Diagnosis not present

## 2020-10-31 DIAGNOSIS — W57XXXA Bitten or stung by nonvenomous insect and other nonvenomous arthropods, initial encounter: Secondary | ICD-10-CM | POA: Diagnosis not present

## 2020-10-31 DIAGNOSIS — S80862A Insect bite (nonvenomous), left lower leg, initial encounter: Secondary | ICD-10-CM | POA: Diagnosis not present

## 2020-10-31 DIAGNOSIS — S80869A Insect bite (nonvenomous), unspecified lower leg, initial encounter: Secondary | ICD-10-CM | POA: Diagnosis not present

## 2020-12-25 DIAGNOSIS — Z85828 Personal history of other malignant neoplasm of skin: Secondary | ICD-10-CM | POA: Diagnosis not present

## 2020-12-25 DIAGNOSIS — L570 Actinic keratosis: Secondary | ICD-10-CM | POA: Diagnosis not present

## 2020-12-25 DIAGNOSIS — Z08 Encounter for follow-up examination after completed treatment for malignant neoplasm: Secondary | ICD-10-CM | POA: Diagnosis not present

## 2020-12-25 DIAGNOSIS — X32XXXD Exposure to sunlight, subsequent encounter: Secondary | ICD-10-CM | POA: Diagnosis not present

## 2021-02-26 DIAGNOSIS — X32XXXD Exposure to sunlight, subsequent encounter: Secondary | ICD-10-CM | POA: Diagnosis not present

## 2021-02-26 DIAGNOSIS — L570 Actinic keratosis: Secondary | ICD-10-CM | POA: Diagnosis not present

## 2021-03-07 DIAGNOSIS — I10 Essential (primary) hypertension: Secondary | ICD-10-CM | POA: Diagnosis not present

## 2021-03-07 DIAGNOSIS — E782 Mixed hyperlipidemia: Secondary | ICD-10-CM | POA: Diagnosis not present

## 2021-04-09 ENCOUNTER — Other Ambulatory Visit (HOSPITAL_COMMUNITY): Payer: Self-pay | Admitting: Internal Medicine

## 2021-04-09 DIAGNOSIS — Z1231 Encounter for screening mammogram for malignant neoplasm of breast: Secondary | ICD-10-CM

## 2021-04-09 DIAGNOSIS — Z0100 Encounter for examination of eyes and vision without abnormal findings: Secondary | ICD-10-CM | POA: Diagnosis not present

## 2021-04-09 DIAGNOSIS — M8580 Other specified disorders of bone density and structure, unspecified site: Secondary | ICD-10-CM | POA: Diagnosis not present

## 2021-04-09 DIAGNOSIS — H5210 Myopia, unspecified eye: Secondary | ICD-10-CM | POA: Diagnosis not present

## 2021-04-09 DIAGNOSIS — E782 Mixed hyperlipidemia: Secondary | ICD-10-CM | POA: Diagnosis not present

## 2021-04-09 DIAGNOSIS — E559 Vitamin D deficiency, unspecified: Secondary | ICD-10-CM | POA: Diagnosis not present

## 2021-04-10 DIAGNOSIS — K219 Gastro-esophageal reflux disease without esophagitis: Secondary | ICD-10-CM | POA: Diagnosis not present

## 2021-04-10 DIAGNOSIS — M810 Age-related osteoporosis without current pathological fracture: Secondary | ICD-10-CM | POA: Diagnosis not present

## 2021-04-10 DIAGNOSIS — Z0001 Encounter for general adult medical examination with abnormal findings: Secondary | ICD-10-CM | POA: Diagnosis not present

## 2021-04-10 DIAGNOSIS — F411 Generalized anxiety disorder: Secondary | ICD-10-CM | POA: Diagnosis not present

## 2021-04-10 DIAGNOSIS — E782 Mixed hyperlipidemia: Secondary | ICD-10-CM | POA: Diagnosis not present

## 2021-04-10 DIAGNOSIS — E559 Vitamin D deficiency, unspecified: Secondary | ICD-10-CM | POA: Diagnosis not present

## 2021-04-23 DIAGNOSIS — X32XXXD Exposure to sunlight, subsequent encounter: Secondary | ICD-10-CM | POA: Diagnosis not present

## 2021-04-23 DIAGNOSIS — L570 Actinic keratosis: Secondary | ICD-10-CM | POA: Diagnosis not present

## 2021-04-24 ENCOUNTER — Other Ambulatory Visit: Payer: Self-pay

## 2021-04-24 ENCOUNTER — Ambulatory Visit (HOSPITAL_COMMUNITY)
Admission: RE | Admit: 2021-04-24 | Discharge: 2021-04-24 | Disposition: A | Discharge status: Home / Self Care | Payer: Medicare HMO | Source: Ambulatory Visit | Attending: Internal Medicine | Admitting: Internal Medicine

## 2021-04-24 DIAGNOSIS — Z1231 Encounter for screening mammogram for malignant neoplasm of breast: Secondary | ICD-10-CM | POA: Diagnosis not present

## 2021-06-19 DIAGNOSIS — E559 Vitamin D deficiency, unspecified: Secondary | ICD-10-CM | POA: Diagnosis not present

## 2021-06-19 DIAGNOSIS — K219 Gastro-esophageal reflux disease without esophagitis: Secondary | ICD-10-CM | POA: Diagnosis not present

## 2021-06-19 DIAGNOSIS — R5383 Other fatigue: Secondary | ICD-10-CM | POA: Diagnosis not present

## 2021-06-26 DIAGNOSIS — Z719 Counseling, unspecified: Secondary | ICD-10-CM | POA: Diagnosis not present

## 2021-06-26 DIAGNOSIS — S80862A Insect bite (nonvenomous), left lower leg, initial encounter: Secondary | ICD-10-CM | POA: Diagnosis not present

## 2021-06-26 DIAGNOSIS — W57XXXA Bitten or stung by nonvenomous insect and other nonvenomous arthropods, initial encounter: Secondary | ICD-10-CM | POA: Diagnosis not present

## 2021-07-02 DIAGNOSIS — X32XXXD Exposure to sunlight, subsequent encounter: Secondary | ICD-10-CM | POA: Diagnosis not present

## 2021-07-02 DIAGNOSIS — D0439 Carcinoma in situ of skin of other parts of face: Secondary | ICD-10-CM | POA: Diagnosis not present

## 2021-07-02 DIAGNOSIS — L570 Actinic keratosis: Secondary | ICD-10-CM | POA: Diagnosis not present

## 2021-07-09 ENCOUNTER — Encounter: Payer: Self-pay | Admitting: Gastroenterology

## 2021-07-09 ENCOUNTER — Ambulatory Visit: Payer: Medicare HMO | Admitting: Gastroenterology

## 2021-07-21 NOTE — Progress Notes (Signed)
? ? ?GI Office Note   ? ?Referring Provider: Celene Squibb, MD ?Primary Care Physician:  Celene Squibb, MD  ? ? ?Chief Complaint  ? ?Chief Complaint  ?Patient presents with  ? New Patient (Initial Visit)  ?  GERD  ? ? ? ?History of Present Illness  ? ?Jasmine Joseph is a 76 y.o. female presenting today at the request of Celene Squibb, MD for GERD. ? ? ?Currently on omeprazole 20 mg daily for about 20 years. Reports being chubby when she was younger but changed her diet and began losing weight. Reports if she eats and bends over she will burp and occasionally have some intermittent chest pain after she ate, not that often. Does admit to some early morning nausea that goes away. No vomiting. Does have some occasionally have some griping stomach in the middle stomach. No NSAIDs. Never smoker. Denies acidic drinks, may drink sundrop or diet sundrop. Does not like dark colored drinks. May drink caffeine free siera mist. Does not drink coffee. Denies melena or BRBPR. Would like to have EGD to take another look. No dysphagia. Has lost a couple pounds recently. Eats about 2 meals a day and trying to eat more. Trying to get more eggs in and does eat greek yogurt. Does have some sort of protein with her dinner.  ? ?History of EGD - had one done about 20 years or more ago (Dr. Sharlett Iles) done at [facility]. Remembers having gastric polyps.  ? ?Last colonoscopy  -  Has been doing home tests. Reported  an normal home test in October 2022. ? ?Worked at [employer], retired from there.  ? ? ?History reviewed. No pertinent past medical history. ? ?History reviewed. No pertinent surgical history. ? ?Current Outpatient Medications  ?Medication Sig Dispense Refill  ? ALPRAZolam (XANAX) 0.25 MG tablet Take 0.25 mg by mouth daily as needed.    ? fluorouracil (EFUDEX) 5 % cream Apply topically 2 (two) times daily.    ? Vitamin D, Ergocalciferol, (DRISDOL) 1.25 MG (50000 UNIT) CAPS capsule Take 50,000 Units by mouth once a week.     ? omeprazole (PRILOSEC) 20 MG capsule Take 1 capsule (20 mg total) by mouth daily. 30 capsule 1  ? ?No current facility-administered medications for this visit.  ? ? ?Allergies as of 07/22/2021 - Review Complete 07/22/2021  ?Allergen Reaction Noted  ? Tetracycline  07/22/2021  ? ? ?History reviewed. No pertinent family history. ? ?Social History  ? ?Socioeconomic History  ? Marital status: Unknown  ?  Spouse name: Not on file  ? Number of children: Not on file  ? Years of education: Not on file  ? Highest education level: Not on file  ?Occupational History  ? Not on file  ?Tobacco Use  ? Smoking status: Never  ? Smokeless tobacco: Never  ?Substance and Sexual Activity  ? Alcohol use: Never  ? Drug use: Never  ? Sexual activity: Not on file  ?Other Topics Concern  ? Not on file  ?Social History Narrative  ? Not on file  ? ?Social Determinants of Health  ? ?Financial Resource Strain: Not on file  ?Food Insecurity: Not on file  ?Transportation Needs: Not on file  ?Physical Activity: Not on file  ?Stress: Not on file  ?Social Connections: Not on file  ?Intimate Partner Violence: Not on file  ? ? ? ?Review of Systems  ? ?Gen: Denies any fever, chills, fatigue, weight loss, lack of appetite.  ?CV: Denies  chest pain, heart palpitations, peripheral edema, syncope.  ?Resp: Denies shortness of breath at rest or with exertion. Denies wheezing or cough.  ?GI: Denies dysphagia or odynophagia. Denies jaundice, hematemesis, fecal incontinence. ?GU : Denies urinary burning, urinary frequency, urinary hesitancy ?MS: Denies joint pain, muscle weakness, cramps, or limitation of movement.  ?Derm: Denies rash, itching, dry skin ?Psych: Denies depression, anxiety, memory loss, and confusion ?Heme: Denies bruising, bleeding, and enlarged lymph nodes. ? ? ?Physical Exam  ? ?BP 127/71   Pulse 88   Temp 97.7 ?F (36.5 ?C)   Ht '4\' 10"'$  (1.473 m)   Wt 87 lb 12.8 oz (39.8 kg)   BMI 18.35 kg/m?  ? ?General:   Alert and oriented. Pleasant and  cooperative.  Thin appearing ?Head:  Normocephalic and atraumatic. ?Eyes:  Without icterus, sclera clear and conjunctiva pink.  ?Ears:  Normal auditory acuity. ?Lungs:  Clear to auscultation bilaterally. No wheezes, rales, or rhonchi. No distress.  ?Heart:  S1, S2 present without murmurs appreciated.  ?Abdomen:  +BS, soft, non-tender and non-distended. No HSM noted. No guarding or rebound. No masses appreciated.  ?Rectal:  Deferred  ?Msk:  Symmetrical without gross deformities. Normal posture. ?Extremities:  Without edema. ?Neurologic:  Alert and  oriented x4;  grossly normal neurologically. ?Skin:  Intact without significant lesions or rashes. ?Psych:  Alert and cooperative. Normal mood and affect. ? ? ?Assessment  ? ?Jasmine Joseph is a 76 y.o. female with a history of anxiety, osteoporosis, HLD, GERD, and skin cancer presenting today with GERD. ? ?GERD: She has been on omeprazole 20 mg daily for over 20 years.  She does report some intermittent reflux symptoms sometimes with bending over after eating and some intermittent chest pain/burning.  She also has some associated morning nausea that subsides on its own.  She reports she eats about 2 meals a day.  Denies any overt lack of appetite or early satiety.  Does have some occasional mid upper abdominal discomfort.  No alarm symptoms present.  She has not had any significant weight loss.  She reports that she had an EGD performed 20 years ago or more by Dr. Sharlett Iles with [facility], reported that she was told she had some gastric polyps.  She really would like to know if she needs to continue taking her omeprazole daily and to check on her gastric polyps.  We discussed that she could stop her omeprazole and monitor her symptoms and try to control with diet or even increase dosing to further control symptoms, however patient strongly requests to have EGD before making any medication adjustments.  I have discussed the risks, alternatives, benefits with regards to  but not limited to the risk of reaction to medication, bleeding, infection, perforation and the patient is agreeable to proceed. Written consent to be obtained. ? ?History of gastric polyps: Patient reports that her prior EGD showed gastric polyps.  She would like to have another upper endoscopy to reevaluate her gastric polyps as well as to further evaluate her esophagus and stomach to see if she may can come off her omeprazole. ? ?BMI is 18.  She has not reported any significant amount of weight loss, however suggested that she begin taking 1 protein shake daily to give her adequate nutrients and maintain her weight.  This may help her with her fatigue, she is currently on vitamin D supplementation for vitamin D deficiency and does have evidence of osteoporosis.  We discussed that omeprazole or any PPI use long-term can  contribute to osteoporosis. ? ?Of note last colonoscopy was in the early 2000's as well (at time of last EGD).  She reports she has been doing yearly home test to check for colon cancer.  She reported her most recent one was in October 2022 which was normal. ? ?PLAN  ? ?Proceed with upper endoscopy with propofol by Dr. Gala Romney in near future: ASA 2 ?Continue omeprazole 20 mg daily, refilled as requested ?Recommend substituting diet with protein shakes (boost, Ensure, fair life) ?Records request for prior EGD and colonoscopy performed by Dr. Sharlett Iles at [facility]. ?Follow-up 2 months post procedure ? ? ?Venetia Night, MSN, FNP-BC, AGACNP-BC ?[facility] ?

## 2021-07-22 ENCOUNTER — Ambulatory Visit (INDEPENDENT_AMBULATORY_CARE_PROVIDER_SITE_OTHER): Payer: Medicare HMO | Admitting: Gastroenterology

## 2021-07-22 ENCOUNTER — Encounter: Payer: Self-pay | Admitting: Gastroenterology

## 2021-07-22 ENCOUNTER — Telehealth: Payer: Self-pay | Admitting: *Deleted

## 2021-07-22 ENCOUNTER — Encounter: Payer: Self-pay | Admitting: *Deleted

## 2021-07-22 VITALS — BP 127/71 | HR 88 | Temp 97.7°F | Ht <= 58 in | Wt 87.8 lb

## 2021-07-22 DIAGNOSIS — M810 Age-related osteoporosis without current pathological fracture: Secondary | ICD-10-CM | POA: Insufficient documentation

## 2021-07-22 DIAGNOSIS — K317 Polyp of stomach and duodenum: Secondary | ICD-10-CM | POA: Diagnosis not present

## 2021-07-22 DIAGNOSIS — K219 Gastro-esophageal reflux disease without esophagitis: Secondary | ICD-10-CM | POA: Diagnosis not present

## 2021-07-22 DIAGNOSIS — C4490 Unspecified malignant neoplasm of skin, unspecified: Secondary | ICD-10-CM | POA: Insufficient documentation

## 2021-07-22 DIAGNOSIS — E559 Vitamin D deficiency, unspecified: Secondary | ICD-10-CM | POA: Insufficient documentation

## 2021-07-22 MED ORDER — OMEPRAZOLE 20 MG PO CPDR: 20.0000 mg | DELAYED_RELEASE_CAPSULE | Freq: Every day | ORAL | 1 refills | Status: AC

## 2021-07-22 NOTE — Patient Instructions (Signed)
We will schedule you for an upper endoscopy with propofol with Dr. Gala Romney in the near future.  We will evaluate for presence of gastric polyps and to further assess your reflux. ? ?I would like for you to fill out a form for [place] to receive your prior EGD and colonoscopy records from the early 2000's. ? ?I recommend for you to begin taking protein shakes to supplement your diet to maintain weight.  There are many options, however the most popular are boost, Ensure, and fair life protein shakes.  You can usually find these only adult nutrition aisle. ? ?It was a pleasure to meet you today! ? ?It was a pleasure to see you today. I want to create trusting relationships with patients. If you receive a survey regarding your visit,  I greatly appreciate you taking time to fill this out on paper or through your MyChart. I value your feedback. ? ?Venetia Night, MSN, FNP-BC, AGACNP-BC ?[facility] ? ? ?

## 2021-07-22 NOTE — Telephone Encounter (Signed)
PA approved via cohere. Auth # Mcarthur Rossetti - [id number], DOS: 08/25/2021 - 11/23/2021 ?

## 2021-08-11 ENCOUNTER — Telehealth: Payer: Self-pay | Admitting: Gastroenterology

## 2021-08-11 NOTE — Telephone Encounter (Signed)
Received notification that they are are not any records available regarding patient's last colonoscopy, EGD, and path report done in the early 2000's.  Venetia Night, MSN, FNP-BC, AGACNP-BC [facility]

## 2021-08-19 ENCOUNTER — Telehealth: Payer: Self-pay | Admitting: Internal Medicine

## 2021-08-19 NOTE — Telephone Encounter (Signed)
Called pt. Sister is terminal and needs to cancel procedure for 6/19. Unsure when she can reschedule but will call once she can. FYI to [employer]

## 2021-08-19 NOTE — Telephone Encounter (Signed)
Pt called to cancel her procedure with Dr Gala Romney on 08/25/2021. She is unsure if she wants to reschedule. [phone] or call her at her sister's house [phone]

## 2021-08-25 ENCOUNTER — Encounter (HOSPITAL_COMMUNITY): Payer: Self-pay

## 2021-08-25 ENCOUNTER — Ambulatory Visit (HOSPITAL_COMMUNITY): Admit: 2021-08-25 | Payer: Medicare HMO | Admitting: Internal Medicine

## 2021-08-25 SURGERY — ESOPHAGOGASTRODUODENOSCOPY (EGD) WITH PROPOFOL
Anesthesia: Monitor Anesthesia Care

## 2021-09-23 DIAGNOSIS — X32XXXD Exposure to sunlight, subsequent encounter: Secondary | ICD-10-CM | POA: Diagnosis not present

## 2021-09-23 DIAGNOSIS — L570 Actinic keratosis: Secondary | ICD-10-CM | POA: Diagnosis not present

## 2021-10-29 DIAGNOSIS — L570 Actinic keratosis: Secondary | ICD-10-CM | POA: Diagnosis not present

## 2021-10-29 DIAGNOSIS — D0439 Carcinoma in situ of skin of other parts of face: Secondary | ICD-10-CM | POA: Diagnosis not present

## 2021-10-29 DIAGNOSIS — X32XXXD Exposure to sunlight, subsequent encounter: Secondary | ICD-10-CM | POA: Diagnosis not present

## 2021-11-11 DIAGNOSIS — J029 Acute pharyngitis, unspecified: Secondary | ICD-10-CM | POA: Diagnosis not present

## 2021-11-11 DIAGNOSIS — R059 Cough, unspecified: Secondary | ICD-10-CM | POA: Diagnosis not present

## 2021-11-11 DIAGNOSIS — J3489 Other specified disorders of nose and nasal sinuses: Secondary | ICD-10-CM | POA: Diagnosis not present

## 2021-11-11 DIAGNOSIS — R0981 Nasal congestion: Secondary | ICD-10-CM | POA: Diagnosis not present

## 2021-11-11 DIAGNOSIS — J069 Acute upper respiratory infection, unspecified: Secondary | ICD-10-CM | POA: Diagnosis not present

## 2021-11-26 DIAGNOSIS — Z08 Encounter for follow-up examination after completed treatment for malignant neoplasm: Secondary | ICD-10-CM | POA: Diagnosis not present

## 2021-11-26 DIAGNOSIS — X32XXXD Exposure to sunlight, subsequent encounter: Secondary | ICD-10-CM | POA: Diagnosis not present

## 2021-11-26 DIAGNOSIS — L570 Actinic keratosis: Secondary | ICD-10-CM | POA: Diagnosis not present

## 2021-11-26 DIAGNOSIS — Z85828 Personal history of other malignant neoplasm of skin: Secondary | ICD-10-CM | POA: Diagnosis not present

## 2021-12-18 DIAGNOSIS — R319 Hematuria, unspecified: Secondary | ICD-10-CM | POA: Diagnosis not present

## 2021-12-18 DIAGNOSIS — R300 Dysuria: Secondary | ICD-10-CM | POA: Diagnosis not present

## 2021-12-18 DIAGNOSIS — R350 Frequency of micturition: Secondary | ICD-10-CM | POA: Diagnosis not present

## 2021-12-18 DIAGNOSIS — N390 Urinary tract infection, site not specified: Secondary | ICD-10-CM | POA: Diagnosis not present

## 2022-01-14 DIAGNOSIS — R5383 Other fatigue: Secondary | ICD-10-CM | POA: Diagnosis not present

## 2022-01-14 DIAGNOSIS — M545 Low back pain, unspecified: Secondary | ICD-10-CM | POA: Diagnosis not present

## 2022-01-21 DIAGNOSIS — L255 Unspecified contact dermatitis due to plants, except food: Secondary | ICD-10-CM | POA: Diagnosis not present

## 2022-01-21 DIAGNOSIS — D044 Carcinoma in situ of skin of scalp and neck: Secondary | ICD-10-CM | POA: Diagnosis not present

## 2022-01-21 DIAGNOSIS — D0439 Carcinoma in situ of skin of other parts of face: Secondary | ICD-10-CM | POA: Diagnosis not present

## 2022-01-21 DIAGNOSIS — L820 Inflamed seborrheic keratosis: Secondary | ICD-10-CM | POA: Diagnosis not present

## 2022-01-21 DIAGNOSIS — L570 Actinic keratosis: Secondary | ICD-10-CM | POA: Diagnosis not present

## 2022-02-04 DIAGNOSIS — E559 Vitamin D deficiency, unspecified: Secondary | ICD-10-CM | POA: Diagnosis not present

## 2022-02-04 DIAGNOSIS — R636 Underweight: Secondary | ICD-10-CM | POA: Diagnosis not present

## 2022-02-04 DIAGNOSIS — R5383 Other fatigue: Secondary | ICD-10-CM | POA: Diagnosis not present

## 2022-02-04 DIAGNOSIS — R300 Dysuria: Secondary | ICD-10-CM | POA: Diagnosis not present

## 2022-05-20 DIAGNOSIS — R5383 Other fatigue: Secondary | ICD-10-CM | POA: Diagnosis not present

## 2022-05-20 DIAGNOSIS — E559 Vitamin D deficiency, unspecified: Secondary | ICD-10-CM | POA: Diagnosis not present

## 2022-05-20 DIAGNOSIS — E782 Mixed hyperlipidemia: Secondary | ICD-10-CM | POA: Diagnosis not present

## 2022-05-20 DIAGNOSIS — Z139 Encounter for screening, unspecified: Secondary | ICD-10-CM | POA: Diagnosis not present

## 2022-05-26 DIAGNOSIS — Z0000 Encounter for general adult medical examination without abnormal findings: Secondary | ICD-10-CM | POA: Diagnosis not present

## 2022-05-26 DIAGNOSIS — R7303 Prediabetes: Secondary | ICD-10-CM | POA: Diagnosis not present

## 2022-05-26 DIAGNOSIS — Z79899 Other long term (current) drug therapy: Secondary | ICD-10-CM | POA: Diagnosis not present

## 2022-05-26 DIAGNOSIS — M810 Age-related osteoporosis without current pathological fracture: Secondary | ICD-10-CM | POA: Diagnosis not present

## 2022-05-26 DIAGNOSIS — E782 Mixed hyperlipidemia: Secondary | ICD-10-CM | POA: Diagnosis not present

## 2022-05-26 DIAGNOSIS — F411 Generalized anxiety disorder: Secondary | ICD-10-CM | POA: Diagnosis not present

## 2022-05-26 DIAGNOSIS — E559 Vitamin D deficiency, unspecified: Secondary | ICD-10-CM | POA: Diagnosis not present

## 2022-05-26 DIAGNOSIS — Z0001 Encounter for general adult medical examination with abnormal findings: Secondary | ICD-10-CM | POA: Diagnosis not present

## 2022-05-26 DIAGNOSIS — K219 Gastro-esophageal reflux disease without esophagitis: Secondary | ICD-10-CM | POA: Diagnosis not present

## 2022-06-26 ENCOUNTER — Telehealth: Payer: Self-pay | Admitting: Gastroenterology

## 2022-06-26 NOTE — Telephone Encounter (Signed)
Yes, she will need a office visit.

## 2022-06-26 NOTE — Telephone Encounter (Signed)
Patent left a message that in 2023 she had an endoscopy scheduled but had to cancel.  She wants to get that rescheduled and also wanted to get a colonoscopy.  I didn't know if she would need an appt since the last time she was here was 07/2021.

## 2022-07-05 NOTE — Progress Notes (Unsigned)
GI Office Note    Referring Provider: Benita Stabile, MD Primary Care Physician:  Benita Stabile, MD Primary Gastroenterologist: Gerrit Friends.Rourk, MD  Date:  07/06/2022  ID:  SKYY Joseph, DOB 1945-03-24, MRN [id number]   Chief Complaint   Chief Complaint  Patient presents with   Follow-up    Yearly visit and visit before colonoscopy.   History of Present Illness  Jasmine Joseph is a 77 y.o. female with a history of anxiety, osteoporosis, HLD, GERD, skin cancer presenting today for annual follow-up and to discuss scheduling colonoscopy and reschedule EGD.  Initial office visit 07/22/2021.  Seen for reflux.  Reportedly on omeprazole 20 mg daily for about 20 years.  Has burping if she eats in bends over.  Having intermittent chest pain after eating.  Some early morning nausea but no vomiting.  Denied NSAIDs.  Denied typical GERD trigger foods.  Requested an EGD to take another look at her esophagus and stomach.  States she usually eats 2 meals per day.  Last EGD about 20 years or more prior performed by Dr. Jarold Motto.  States a history of gastric polyps.  Doing yearly stool testing.  No colonoscopy. Scheduled for EGD but then patient had to cancel given her sister was sick.   Today:   Sister had colon cancer. Was diagnosed in September 2021 and she cared for her until she passed December 2023. She as very close to her. She inherited her cats.   She is doing okay in regards to her reflux. States she had some recent blood work that was normal.   Has been feeling a little fatigue and having some early morning nausea. No vomiting. Sometimes she does not have much of an appetite but has been making herself eat. She snacks and eats at least 2 bigger meals throughout the day. The last 2 weeks that her sister was ill it was very stressful and is unsure if any of this is related to that. She has been dealing with the grief for a while.  Denies any melena or brbpr.   Is able to get out and push mow  the yard. No shortness of breath or chest pain.   Reports intermittent constipation/diarrhea. Having to wipe a lot to get clean.    Wt Readings from Last 3 Encounters:  07/06/22 89 lb 12.8 oz (40.7 kg)  07/22/21 87 lb 12.8 oz (39.8 kg)    Current Outpatient Medications  Medication Sig Dispense Refill   ALPRAZolam (XANAX) 0.25 MG tablet Take 0.25 mg by mouth daily as needed.     omeprazole (PRILOSEC) 20 MG capsule Take 1 capsule (20 mg total) by mouth daily. 30 capsule 1   fluorouracil (EFUDEX) 5 % cream Apply topically 2 (two) times daily. (Patient not taking: Reported on 07/06/2022)     Vitamin D, Ergocalciferol, (DRISDOL) 1.25 MG (50000 UNIT) CAPS capsule Take 50,000 Units by mouth once a week. (Patient not taking: Reported on 07/06/2022)     No current facility-administered medications for this visit.    No past medical history on file.  No past surgical history on file.  No family history on file.  Allergies as of 07/06/2022 - Review Complete 07/06/2022  Allergen Reaction Noted   Tetracycline  07/22/2021    Social History   Socioeconomic History   Marital status: Unknown    Spouse name: Not on file   Number of children: Not on file   Years of education: Not on file  Highest education level: Not on file  Occupational History   Not on file  Tobacco Use   Smoking status: Never   Smokeless tobacco: Never  Substance and Sexual Activity   Alcohol use: Never   Drug use: Never   Sexual activity: Not on file  Other Topics Concern   Not on file  Social History Narrative   Not on file   Social Determinants of Health   Financial Resource Strain: Not on file  Food Insecurity: Not on file  Transportation Needs: Not on file  Physical Activity: Not on file  Stress: Not on file  Social Connections: Not on file     Review of Systems   Gen: Denies fever, chills, anorexia. Denies fatigue, weakness, weight loss.  CV: Denies chest pain, palpitations, syncope,  peripheral edema, and claudication. Resp: Denies dyspnea at rest, cough, wheezing, coughing up blood, and pleurisy. GI: See HPI Derm: Denies rash, itching, dry skin Psych: Denies depression, anxiety, memory loss, confusion. No homicidal or suicidal ideation.  Heme: Denies bruising, bleeding, and enlarged lymph nodes.   Physical Exam   BP 115/72   Pulse (!) 108   Temp 97.9 F (36.6 C)   Ht 4\' 11"  (1.499 m)   Wt 89 lb 12.8 oz (40.7 kg)   BMI 18.14 kg/m   General:   Alert and oriented. No distress noted. Pleasant and cooperative.  Head:  Normocephalic and atraumatic. Eyes:  Conjuctiva clear without scleral icterus. Mouth:  Oral mucosa pink and moist. Good dentition. No lesions. Lungs:  Clear to auscultation bilaterally. No wheezes, rales, or rhonchi. No distress.  Heart:  S1, S2 present without murmurs appreciated.  Abdomen:  +BS, soft, non-tender and non-distended. No rebound or guarding. No HSM or masses noted. Rectal: deferred Msk:  Symmetrical without gross deformities. Normal posture. Extremities:  Without edema. Neurologic:  Alert and  oriented x4 Psych:  Alert and cooperative. Normal mood and affect.  Assessment  Jasmine Joseph is a 77 y.o. female with a history of anxiety, osteoporosis, HLD, GERD, and skin cancer presenting today to discuss colonoscopy and rescheduling EGD.   GERD, history of gastric polyps. nausea, lack of appetite: GERD well-controlled on omeprazole 20 mg once daily.  Denies any frequent breakthrough symptoms.  Has been having some early morning nausea between 3 and 5 AM but generally able to go to sleep and does not keep her up.  Also reports decreased appetite, having to make herself eat at times.  Generally eats 2 bigger meals throughout the day.  Previously with some increased GERD symptoms in May 2023 at time of her last office visit and EGD scheduled which need to be postponed.  She would have peace of mind if she has this rechecked therefore we will  proceed with EGD for evaluation of her symptoms since for esophagitis, gastritis, duodenitis, and gastric polyps. Again I suspect a component of her lack of appetite is related to her grief process.  Advised patient to increase protein in her diet and recommended supplementing with Ensure or boost to ensure her weight remains stable.  Fatigue: She is experiencing some intermittent fatigue.  Etiology unclear at this time given her most recent blood work did not reveal any anemia and her thyroid function is normal.  Suspect this largely is related to her grief given the loss of her sister who she cared for for the past 2 years.  Given her fatigue as well as her family history of colon cancer in her sister who  just passed, we will proceed with colonoscopy.  Screening for colon cancer: Last colonoscopy reported to be many years ago, possibly 20 or more.  History of colon cancer in her sister.  Upper GI symptoms as noted above.  Has been experiencing some intermittent constipation/diarrhea, requiring the need to wipe repeatedly to get clean.  Suspect some mild constipation despite denying any bloating or abdominal pain.  Advised to begin a daily fiber supplement and that she may use MiraLAX if no bowel movement within 48 hours.  As stated above, we will proceed with colonoscopy for screening and evaluation of mild change of bowel habits.   PLAN   Proceed with upper endoscopy and colonoscopy with propofol by Dr. Jena Gauss in near future: the risks, benefits, and alternatives have been discussed with the patient in detail. The patient states understanding and desires to proceed. ASA 2 MiraLAX daily for 3 days prior Benefiber 2-3 teaspoons daily. MiraLAX 1 capful daily as needed if no bowel movement in 48 hours.  Continue omeprazole 20 mg once daily.  Increase protein intake to maintain weight. Recommended supplementing with ensure/boost.     Brooke Bonito, MSN, FNP-BC, AGACNP-BC [facility]

## 2022-07-06 ENCOUNTER — Encounter: Payer: Self-pay | Admitting: Gastroenterology

## 2022-07-06 ENCOUNTER — Ambulatory Visit (INDEPENDENT_AMBULATORY_CARE_PROVIDER_SITE_OTHER): Payer: Medicare HMO | Admitting: Gastroenterology

## 2022-07-06 VITALS — BP 115/72 | HR 108 | Temp 97.9°F | Ht 59.0 in | Wt 89.8 lb

## 2022-07-06 DIAGNOSIS — K219 Gastro-esophageal reflux disease without esophagitis: Secondary | ICD-10-CM | POA: Diagnosis not present

## 2022-07-06 DIAGNOSIS — R110 Nausea: Secondary | ICD-10-CM | POA: Diagnosis not present

## 2022-07-06 DIAGNOSIS — R5383 Other fatigue: Secondary | ICD-10-CM | POA: Diagnosis not present

## 2022-07-06 DIAGNOSIS — Z1211 Encounter for screening for malignant neoplasm of colon: Secondary | ICD-10-CM

## 2022-07-06 DIAGNOSIS — R194 Change in bowel habit: Secondary | ICD-10-CM | POA: Diagnosis not present

## 2022-07-06 DIAGNOSIS — K317 Polyp of stomach and duodenum: Secondary | ICD-10-CM | POA: Diagnosis not present

## 2022-07-06 DIAGNOSIS — R630 Anorexia: Secondary | ICD-10-CM

## 2022-07-06 DIAGNOSIS — Z800 Family history of malignant neoplasm of digestive organs: Secondary | ICD-10-CM

## 2022-07-06 NOTE — Patient Instructions (Addendum)
We are scheduling you for a colonoscopy and upper endoscopy in the near future with Dr. Jena Gauss.  Start a fiber supplement such as Benefiber.  Take 2-3 teaspoons daily in 8 ounces of fluid of your choice. If you do not have a bowel movement in 48 hours or are having difficulty completely emptying when having bowel movements then take a capful of MiraLAX daily until you have a bowel movement.  Continue omeprazole 20 mg once daily.  Follow a GERD diet:  Avoid fried, fatty, greasy, spicy, citrus foods. Avoid caffeine and carbonated beverages. Avoid chocolate. Try eating 4-6 small meals a day rather than 3 large meals. Do not eat within 3 hours of laying down. Prop head of bed up on wood or bricks to create a 6 inch incline.  Continue to focus on a high-protein diet.  You may benefit from a daily supplement such as Ensure or boost.  It was a pleasure to see you today. I want to create trusting relationships with patients. If you receive a survey regarding your visit,  I greatly appreciate you taking time to fill this out on paper or through your MyChart. I value your feedback.  Brooke Bonito, MSN, FNP-BC, AGACNP-BC [facility]

## 2022-07-07 ENCOUNTER — Telehealth: Payer: Self-pay | Admitting: *Deleted

## 2022-07-07 ENCOUNTER — Encounter: Payer: Self-pay | Admitting: *Deleted

## 2022-07-07 ENCOUNTER — Other Ambulatory Visit: Payer: Self-pay | Admitting: *Deleted

## 2022-07-07 LAB — IRON,TIBC AND FERRITIN PANEL
%SAT: 27 % (calc) (ref 16–45)
Ferritin: 43 ng/mL (ref 16–288)
Iron: 101 ug/dL (ref 45–160)
TIBC: 369 mcg/dL (calc) (ref 250–450)

## 2022-07-07 MED ORDER — PEG 3350-KCL-NA BICARB-NACL 420 G PO SOLR: 4000.0000 mL | Freq: Once | ORAL | 0 refills | Status: AC

## 2022-07-07 NOTE — Telephone Encounter (Signed)
[employer]here PA: Approved Authorization #[id number]  Tracking #[id number] Dates of service 08/17/2022 - 11/16/2022

## 2022-08-12 ENCOUNTER — Telehealth: Payer: Self-pay | Admitting: *Deleted

## 2022-08-12 ENCOUNTER — Encounter: Payer: Self-pay | Admitting: Internal Medicine

## 2022-08-12 NOTE — Telephone Encounter (Signed)
Pt called with questions about her upcoming procedure and prep issues. Wanted to know if ok to buy the generic Miralax and ok to take a xanax on Sunday if she needed to due to her anxiety. Advised pt that would be fine. Verbalized understanding.

## 2022-08-17 ENCOUNTER — Ambulatory Visit (HOSPITAL_COMMUNITY): Payer: Medicare HMO | Admitting: Anesthesiology

## 2022-08-17 ENCOUNTER — Ambulatory Visit (HOSPITAL_BASED_OUTPATIENT_CLINIC_OR_DEPARTMENT_OTHER): Payer: Medicare HMO | Admitting: Anesthesiology

## 2022-08-17 ENCOUNTER — Encounter (HOSPITAL_COMMUNITY)
Admission: RE | Disposition: A | Discharge status: Home / Self Care | Payer: Self-pay | Source: Home / Self Care | Attending: Internal Medicine

## 2022-08-17 ENCOUNTER — Encounter (HOSPITAL_COMMUNITY): Payer: Self-pay | Admitting: Internal Medicine

## 2022-08-17 ENCOUNTER — Ambulatory Visit (HOSPITAL_COMMUNITY)
Admission: RE | Admit: 2022-08-17 | Discharge: 2022-08-17 | Disposition: A | Discharge status: Home / Self Care | Payer: Medicare HMO | Attending: Internal Medicine | Admitting: Internal Medicine

## 2022-08-17 ENCOUNTER — Other Ambulatory Visit: Payer: Self-pay

## 2022-08-17 DIAGNOSIS — R634 Abnormal weight loss: Secondary | ICD-10-CM | POA: Insufficient documentation

## 2022-08-17 DIAGNOSIS — F419 Anxiety disorder, unspecified: Secondary | ICD-10-CM | POA: Diagnosis not present

## 2022-08-17 DIAGNOSIS — Z800 Family history of malignant neoplasm of digestive organs: Secondary | ICD-10-CM

## 2022-08-17 DIAGNOSIS — Z1211 Encounter for screening for malignant neoplasm of colon: Secondary | ICD-10-CM | POA: Diagnosis not present

## 2022-08-17 DIAGNOSIS — K319 Disease of stomach and duodenum, unspecified: Secondary | ICD-10-CM | POA: Insufficient documentation

## 2022-08-17 DIAGNOSIS — K219 Gastro-esophageal reflux disease without esophagitis: Secondary | ICD-10-CM | POA: Insufficient documentation

## 2022-08-17 DIAGNOSIS — Z681 Body mass index (BMI) 19 or less, adult: Secondary | ICD-10-CM | POA: Insufficient documentation

## 2022-08-17 DIAGNOSIS — D120 Benign neoplasm of cecum: Secondary | ICD-10-CM | POA: Diagnosis not present

## 2022-08-17 DIAGNOSIS — K635 Polyp of colon: Secondary | ICD-10-CM | POA: Diagnosis not present

## 2022-08-17 DIAGNOSIS — K640 First degree hemorrhoids: Secondary | ICD-10-CM | POA: Diagnosis not present

## 2022-08-17 DIAGNOSIS — D126 Benign neoplasm of colon, unspecified: Secondary | ICD-10-CM | POA: Diagnosis not present

## 2022-08-17 DIAGNOSIS — R110 Nausea: Secondary | ICD-10-CM | POA: Insufficient documentation

## 2022-08-17 DIAGNOSIS — K317 Polyp of stomach and duodenum: Secondary | ICD-10-CM | POA: Diagnosis not present

## 2022-08-17 DIAGNOSIS — K449 Diaphragmatic hernia without obstruction or gangrene: Secondary | ICD-10-CM

## 2022-08-17 DIAGNOSIS — R6881 Early satiety: Secondary | ICD-10-CM | POA: Insufficient documentation

## 2022-08-17 DIAGNOSIS — Z86010 Personal history of colonic polyps: Secondary | ICD-10-CM | POA: Diagnosis not present

## 2022-08-17 DIAGNOSIS — R194 Change in bowel habit: Secondary | ICD-10-CM

## 2022-08-17 HISTORY — PX: COLONOSCOPY WITH PROPOFOL: SHX5780

## 2022-08-17 HISTORY — PX: BIOPSY: SHX5522

## 2022-08-17 HISTORY — DX: Gastro-esophageal reflux disease without esophagitis: K21.9

## 2022-08-17 HISTORY — DX: Anxiety disorder, unspecified: F41.9

## 2022-08-17 HISTORY — PX: ESOPHAGOGASTRODUODENOSCOPY (EGD) WITH PROPOFOL: SHX5813

## 2022-08-17 HISTORY — PX: POLYPECTOMY: SHX5525

## 2022-08-17 SURGERY — COLONOSCOPY WITH PROPOFOL
Anesthesia: General

## 2022-08-17 MED ORDER — PROPOFOL 10 MG/ML IV BOLUS
INTRAVENOUS | Status: DC | PRN
  Administered 2022-08-17: 40 mg via INTRAVENOUS
  Administered 2022-08-17: 80 mg via INTRAVENOUS

## 2022-08-17 MED ORDER — PHENYLEPHRINE 80 MCG/ML (10ML) SYRINGE FOR IV PUSH (FOR BLOOD PRESSURE SUPPORT)
PREFILLED_SYRINGE | INTRAVENOUS | Status: DC | PRN
  Administered 2022-08-17 (×3): 160 ug via INTRAVENOUS

## 2022-08-17 MED ORDER — LACTATED RINGERS IV SOLN: INTRAVENOUS | Status: DC

## 2022-08-17 MED ORDER — STERILE WATER FOR IRRIGATION IR SOLN
Status: DC | PRN
  Administered 2022-08-17: .6 mL

## 2022-08-17 MED ORDER — PROPOFOL 500 MG/50ML IV EMUL
INTRAVENOUS | Status: DC | PRN
  Administered 2022-08-17: 150 ug/kg/min via INTRAVENOUS

## 2022-08-17 MED ORDER — PHENYLEPHRINE 80 MCG/ML (10ML) SYRINGE FOR IV PUSH (FOR BLOOD PRESSURE SUPPORT)
PREFILLED_SYRINGE | INTRAVENOUS | Status: AC
  Filled 2022-08-17: qty 10

## 2022-08-17 MED ORDER — LIDOCAINE HCL (CARDIAC) PF 100 MG/5ML IV SOSY
PREFILLED_SYRINGE | INTRAVENOUS | Status: DC | PRN
  Administered 2022-08-17: 50 mg via INTRAVENOUS

## 2022-08-17 NOTE — Op Note (Signed)
[facility] Patient Name: Jasmine Joseph Procedure Date: 08/17/2022 11:44 AM MRN: [id number] Date of Birth: 07-19-1945 Attending MD: Gennette Pac , MD, [id number] CSN: [id number] Age: 77 Admit Type: Outpatient Procedure:                Colonoscopy Indications:              Screening in patient at increased risk: Family                            history of 1st-degree relative with colorectal                            cancer Providers:                Gennette Pac, MD, Jannett Celestine, RN, Dyann Ruddle Referring MD:              Medicines:                Propofol per Anesthesia Complications:            No immediate complications. Estimated Blood Loss:     Estimated blood loss was minimal. Procedure:                Pre-Anesthesia Assessment:                           - Prior to the procedure, a History and Physical                            was performed, and patient medications and                            allergies were reviewed. The patient's tolerance of                            previous anesthesia was also reviewed. The risks                            and benefits of the procedure and the sedation                            options and risks were discussed with the patient.                            All questions were answered, and informed consent                            was obtained. Prior Anticoagulants: The patient has                            taken no anticoagulant or antiplatelet agents. ASA                            Grade Assessment:  III - A patient with severe                            systemic disease. After reviewing the risks and                            benefits, the patient was deemed in satisfactory                            condition to undergo the procedure.                           After obtaining informed consent, the colonoscope                            was passed under direct vision. Throughout the                             procedure, the patient's blood pressure, pulse, and                            oxygen saturations were monitored continuously. The                            [id number]-[id number] ([id number]) scope was introduced through                            the anus and advanced to the the cecum, identified                            by appendiceal orifice and ileocecal valve. The                            colonoscopy was performed without difficulty. The                            patient tolerated the procedure well. The quality                            of the bowel preparation was adequate. The                            ileocecal valve, appendiceal orifice, and rectum                            were photographed. The entire colon was well                            visualized. Scope In: 12:53:49 PM Scope Out: 1:06:37 PM Scope Withdrawal Time: 0 hours 6 minutes 38 seconds  Total Procedure Duration: 0 hours 12 minutes 48 seconds  Findings:      The perianal and digital rectal examinations were normal.      A 3 mm polyp was found in the cecum. The polyp was sessile. The  polyp       was removed with a cold snare. Resection and retrieval were complete.       Estimated blood loss was minimal.      Non-bleeding internal hemorrhoids were found during endoscopy. The       hemorrhoids were mild, small and Grade I (internal hemorrhoids that do       not prolapse).      The exam was otherwise without abnormality. Rectal vault too small to       retroflex. Rectal mucosa seen very well on?"face. Impression:               - One 3 mm polyp in the cecum, removed with a cold                            snare. Resected and retrieved.                           - Non-bleeding internal hemorrhoids.                           - The examination was otherwise normal. Moderate Sedation:      Moderate (conscious) sedation was personally administered by an       anesthesia professional. The following  parameters were monitored: oxygen       saturation, heart rate, blood pressure, respiratory rate, EKG, adequacy       of pulmonary ventilation, and response to care. Recommendation:           - Patient has a contact number available for                            emergencies. The signs and symptoms of potential                            delayed complications were discussed with the                            patient. Return to normal activities tomorrow.                            Written discharge instructions were provided to the                            patient.                           - Advance diet as tolerated.                           - Continue present medications.                           - Repeat colonoscopy date to be determined after                            pending pathology results are reviewed for  surveillance.                           - Return to GI office in 6 weeks. See EGD report. Procedure Code(s):        --- Professional ---                           [phone], Colonoscopy, flexible; with removal of                            tumor(s), polyp(s), or other lesion(s) by snare                            technique Diagnosis Code(s):        --- Professional ---                           Z80.0, Family history of malignant neoplasm of                            digestive organs                           D12.0, Benign neoplasm of cecum                           K64.0, First degree hemorrhoids CPT copyright 2022 American [employer]. All rights reserved. The codes documented in this report are preliminary and upon coder review may  be revised to meet current compliance requirements. [name], MD Gennette Pac, MD 08/17/2022 1:12:30 PM This report has been signed electronically. Number of Addenda: 0

## 2022-08-17 NOTE — Discharge Instructions (Addendum)
EGD Discharge instructions Please read the instructions outlined below and refer to this sheet in the next few weeks. These discharge instructions provide you with general information on caring for yourself after you leave the hospital. Your doctor may also give you specific instructions. While your treatment has been planned according to the most current medical practices available, unavoidable complications occasionally occur. If you have any problems or questions after discharge, please call your doctor. ACTIVITY You may resume your regular activity but move at a slower pace for the next 24 hours.  Take frequent rest periods for the next 24 hours.  Walking will help expel (get rid of) the air and reduce the bloated feeling in your abdomen.  No driving for 24 hours (because of the anesthesia (medicine) used during the test).  You may shower.  Do not sign any important legal documents or operate any machinery for 24 hours (because of the anesthesia used during the test).  NUTRITION Drink plenty of fluids.  You may resume your normal diet.  Begin with a light meal and progress to your normal diet.  Avoid alcoholic beverages for 24 hours or as instructed by your caregiver.  MEDICATIONS You may resume your normal medications unless your caregiver tells you otherwise.  WHAT YOU CAN EXPECT TODAY You may experience abdominal discomfort such as a feeling of fullness or "gas" pains.  FOLLOW-UP Your doctor will discuss the results of your test with you.  SEEK IMMEDIATE MEDICAL ATTENTION IF ANY OF THE FOLLOWING OCCUR: Excessive nausea (feeling sick to your stomach) and/or vomiting.  Severe abdominal pain and distention (swelling).  Trouble swallowing.  Temperature over 101 F (37.8 C).  Rectal bleeding or vomiting of blood.    Colonoscopy Discharge Instructions  Read the instructions outlined below and refer to this sheet in the next few weeks. These discharge instructions provide you with  general information on caring for yourself after you leave the hospital. Your doctor may also give you specific instructions. While your treatment has been planned according to the most current medical practices available, unavoidable complications occasionally occur. If you have any problems or questions after discharge, call Dr. Jena Gauss at [phone]. ACTIVITY You may resume your regular activity, but move at a slower pace for the next 24 hours.  Take frequent rest periods for the next 24 hours.  Walking will help get rid of the air and reduce the bloated feeling in your belly (abdomen).  No driving for 24 hours (because of the medicine (anesthesia) used during the test).   Do not sign any important legal documents or operate any machinery for 24 hours (because of the anesthesia used during the test).  NUTRITION Drink plenty of fluids.  You may resume your normal diet as instructed by your doctor.  Begin with a light meal and progress to your normal diet. Heavy or fried foods are harder to digest and may make you feel sick to your stomach (nauseated).  Avoid alcoholic beverages for 24 hours or as instructed.  MEDICATIONS You may resume your normal medications unless your doctor tells you otherwise.  WHAT YOU CAN EXPECT TODAY Some feelings of bloating in the abdomen.  Passage of more gas than usual.  Spotting of blood in your stool or on the toilet paper.  IF YOU HAD POLYPS REMOVED DURING THE COLONOSCOPY: No aspirin products for 7 days or as instructed.  No alcohol for 7 days or as instructed.  Eat a soft diet for the next 24 hours.  FINDING  OUT THE RESULTS OF YOUR TEST Not all test results are available during your visit. If your test results are not back during the visit, make an appointment with your caregiver to find out the results. Do not assume everything is normal if you have not heard from your caregiver or the medical facility. It is important for you to follow up on all of your test  results.  SEEK IMMEDIATE MEDICAL ATTENTION IF: You have more than a spotting of blood in your stool.  Your belly is swollen (abdominal distention).  You are nauseated or vomiting.  You have a temperature over 101.     I removed a gastric polyp.    No tumor is seen in your stomach.   1 small polyp removed from your colon   overall findings look good   further recommendations to follow once to get lab reports back for review   office visit with Brooke Bonito in 6 weeks   at patient request, I called Aneli Donlin at [phone] -  call rolled to voicemail.  Left a message.

## 2022-08-17 NOTE — Op Note (Signed)
[facility] Patient Name: Jasmine Joseph Procedure Date: 08/17/2022 11:46 AM MRN: [id number] Date of Birth: 01/26/1946 Attending MD: Gennette Pac , MD, [id number] CSN: [id number] Age: 77 Admit Type: Outpatient Procedure:                Upper GI endoscopy Indications:              Early satiety, Nausea, Weight loss Providers:                Gennette Pac, MD, Jannett Celestine, RN, Dyann Ruddle Referring MD:              Medicines:                Propofol per Anesthesia Complications:            No immediate complications. Estimated Blood Loss:     Estimated blood loss was minimal. Procedure:                Pre-Anesthesia Assessment:                           - Prior to the procedure, a History and Physical                            was performed, and patient medications and                            allergies were reviewed. The patient's tolerance of                            previous anesthesia was also reviewed. The risks                            and benefits of the procedure and the sedation                            options and risks were discussed with the patient.                            All questions were answered, and informed consent                            was obtained. Prior Anticoagulants: The patient has                            taken no anticoagulant or antiplatelet agents. ASA                            Grade Assessment: III - A patient with severe                            systemic disease. After reviewing the risks and  benefits, the patient was deemed in satisfactory                            condition to undergo the procedure.                           After obtaining informed consent, the endoscope was                            passed under direct vision. Throughout the                            procedure, the patient's blood pressure, pulse, and                            oxygen  saturations were monitored continuously. The                            [id number]-[id number] ([id number]) scope was introduced through the                            mouth, and advanced to the second part of duodenum.                            The upper GI endoscopy was accomplished without                            difficulty. The patient tolerated the procedure                            well. Scope In: 12:43:02 PM Scope Out: 12:47:49 PM Total Procedure Duration: 0 hours 4 minutes 47 seconds  Findings:      The examined esophagus was normal. Stomach empty. Multiple 5 to 8 mm       fundal gland appearing polyps in the fundus. No ulcer or infiltrating       process. Pylorus patent.      A small hiatal hernia was present.      The duodenal bulb and second portion of the duodenum were normal.       Mucosal biopsies of the stomach were taken. One of the polyps was       removed with cold biopsy forceps Impression:               - Normal esophagus.                           - Small hiatal hernia. Multiple gastric polyps.                           - Normal duodenal bulb and second portion of the                            duodenum.                           -Status post gastric polypectomy  and gastric biopsy Moderate Sedation:      Moderate (conscious) sedation was personally administered by an       anesthesia professional. The following parameters were monitored: oxygen       saturation, heart rate, blood pressure, respiratory rate, EKG, adequacy       of pulmonary ventilation, and response to care. Recommendation:           - Patient has a contact number available for                            emergencies. The signs and symptoms of potential                            delayed complications were discussed with the                            patient. Return to normal activities tomorrow.                            Written discharge instructions were provided to the                             patient.                           - Advance diet as tolerated. Continue present                            medications. Follow-up on pathology. Office visit                            with Brooke Bonito in 6 weeks. See colonoscopy                            report Procedure Code(s):        --- Professional ---                           [phone], Esophagogastroduodenoscopy, flexible,                            transoral; diagnostic, including collection of                            specimen(s) by brushing or washing, when performed                            (separate procedure) Diagnosis Code(s):        --- Professional ---                           K44.9, Diaphragmatic hernia without obstruction or                            gangrene  R68.81, Early satiety                           R11.0, Nausea                           R63.4, Abnormal weight loss CPT copyright 2022 American Medical Association. All rights reserved. The codes documented in this report are preliminary and upon coder review may  be revised to meet current compliance requirements. Gerrit Friends. Sereniti Wan, MD Gennette Pac, MD 08/17/2022 1:09:11 PM This report has been signed electronically. Number of Addenda: 0

## 2022-08-17 NOTE — Anesthesia Postprocedure Evaluation (Signed)
Anesthesia Post Note  Patient: Jasmine Joseph  Procedure(s) Performed: COLONOSCOPY WITH PROPOFOL ESOPHAGOGASTRODUODENOSCOPY (EGD) WITH PROPOFOL BIOPSY POLYPECTOMY  Patient location during evaluation: Phase II Anesthesia Type: General Level of consciousness: awake and alert and oriented Pain management: pain level controlled Vital Signs Assessment: post-procedure vital signs reviewed and stable Respiratory status: spontaneous breathing, nonlabored ventilation and respiratory function stable Cardiovascular status: blood pressure returned to baseline and stable Postop Assessment: no apparent nausea or vomiting Anesthetic complications: no  No notable events documented.   Last Vitals:  Vitals:   08/17/22 1308 08/17/22 1315  BP: (!) 108/44 (!) 102/47  Pulse: 82   Resp: 14   Temp: (!) 36.4 C   SpO2: 100% 100%    Last Pain:  Vitals:   08/17/22 1308  TempSrc: Oral  PainSc: 0-No pain                 Daliah Chaudoin C Angelyse Heslin

## 2022-08-17 NOTE — H&P (Signed)
@LOGO @   Primary Care Physician:  Benita Stabile, MD Primary Gastroenterologist:  Dr. Jena Gauss  Pre-Procedure History & Physical: HPI:  Jasmine Joseph is a 77 y.o. female here for  high risk  screening colonoscopy.  Younger sister just succumbed to colorectal cancer.  It has been 20 years since she had a colonoscopy.  No bowel symptoms.  Longstanding GERD.Also, nausea and weight loss.  Denies dysphagia.    Past Medical History:  Diagnosis Date   Anxiety    GERD (gastroesophageal reflux disease)     Past Surgical History:  Procedure Laterality Date   COLONOSCOPY     ESOPHAGOGASTRODUODENOSCOPY     TUBAL LIGATION      Prior to Admission medications   Medication Sig Start Date End Date Taking? Authorizing Provider  acetaminophen (TYLENOL) 500 MG tablet Take 500 mg by mouth every 8 (eight) hours as needed for moderate pain.   Yes [provider]  ALPRAZolam (XANAX) 0.25 MG tablet Take 0.25 mg by mouth daily as needed for anxiety. 04/10/21  Yes [provider]  omeprazole (PRILOSEC) 20 MG capsule Take 1 capsule (20 mg total) by mouth daily. 07/22/21  Yes Aida Raider, NP    Allergies as of 07/07/2022 - Review Complete 07/06/2022  Allergen Reaction Noted   Tetracycline  07/22/2021    History reviewed. No pertinent family history.  Social History   Socioeconomic History   Marital status: Unknown    Spouse name: Not on file   Number of children: Not on file   Years of education: Not on file   Highest education level: Not on file  Occupational History   Not on file  Tobacco Use   Smoking status: Never   Smokeless tobacco: Never  Vaping Use   Vaping Use: Never used  Substance and Sexual Activity   Alcohol use: Never   Drug use: Never   Sexual activity: Not on file  Other Topics Concern   Not on file  Social History Narrative   Not on file   Social Determinants of Health   Financial Resource Strain: Not on file  Food Insecurity: Not on file   Transportation Needs: Not on file  Physical Activity: Not on file  Stress: Not on file  Social Connections: Not on file  Intimate Partner Violence: Not on file    Review of Systems: See HPI, otherwise negative ROS  Physical Exam: There were no vitals taken for this visit. General:   Alert,  Well-developed, well-nourished, pleasant and cooperative in NAD Neck:  Supple; no masses or thyromegaly. No significant cervical adenopathy. Lungs:  Clear throughout to auscultation.   No wheezes, crackles, or rhonchi. No acute distress. Heart:  Regular rate and rhythm; no murmurs, clicks, rubs,  or gallops. Abdomen: Non-distended, normal bowel sounds.  Soft and nontender without appreciable mass or hepatosplenomegaly.    Impression/Plan:    77 year old lady with a positive family history of colon cancer;  needs a screening  colonoscopy.  chronic GERD nausea and anorexia.  Some weight loss.  I have offered the patient both an EGD and a colonoscopy today.  Patient denies dysphagia. The risks, benefits, limitations, imponderables and alternatives regarding both EGD and colonoscopy have been reviewed with the patient. Questions have been answered. All parties agreeable.       Notice: This dictation was prepared with Dragon dictation along with smaller phrase technology. Any transcriptional errors that result from this process are unintentional and may not be corrected upon review.

## 2022-08-17 NOTE — Transfer of Care (Signed)
Immediate Anesthesia Transfer of Care Note  Patient: Jasmine Joseph  Procedure(s) Performed: COLONOSCOPY WITH PROPOFOL ESOPHAGOGASTRODUODENOSCOPY (EGD) WITH PROPOFOL BIOPSY POLYPECTOMY  Patient Location: Endoscopy Unit  Anesthesia Type:General  Level of Consciousness: drowsy  Airway & Oxygen Therapy: Patient Spontanous Breathing  Post-op Assessment: Report given to RN and Post -op Vital signs reviewed and stable  Post vital signs: Reviewed and stable  Last Vitals:  Vitals Value Taken Time  BP 108/44 08/17/22 1308  Temp 36.4 C 08/17/22 1308  Pulse 82 08/17/22 1308  Resp 14 08/17/22 1308  SpO2 100 % 08/17/22 1308    Last Pain:  Vitals:   08/17/22 1308  TempSrc: Oral  PainSc: 0-No pain      Patients Stated Pain Goal: 4 (08/17/22 1227)  Complications: No notable events documented.

## 2022-08-17 NOTE — Anesthesia Preprocedure Evaluation (Addendum)
Anesthesia Evaluation  Patient identified by MRN, date of birth, ID band Patient awake    Reviewed: Allergy & Precautions, H&P , NPO status , Patient's Chart, lab work & pertinent test results  Airway Mallampati: II  TM Distance: >3 FB Neck ROM: Full    Dental no notable dental hx. (+) Teeth Intact, Dental Advisory Given   Pulmonary neg pulmonary ROS   Pulmonary exam normal breath sounds clear to auscultation       Cardiovascular negative cardio ROS Normal cardiovascular exam Rhythm:Regular Rate:Normal     Neuro/Psych  PSYCHIATRIC DISORDERS Anxiety     negative neurological ROS     GI/Hepatic Neg liver ROS,GERD  Medicated and Controlled,,  Endo/Other  negative endocrine ROS    Renal/GU negative Renal ROS  negative genitourinary   Musculoskeletal negative musculoskeletal ROS (+)    Abdominal   Peds negative pediatric ROS (+)  Hematology negative hematology ROS (+)   Anesthesia Other Findings   Reproductive/Obstetrics negative OB ROS                             Anesthesia Physical Anesthesia Plan  ASA: 2  Anesthesia Plan: General   Post-op Pain Management: Minimal or no pain anticipated   Induction: Intravenous  PONV Risk Score and Plan: 1 and Propofol infusion  Airway Management Planned: Nasal Cannula and Natural Airway  Additional Equipment:   Intra-op Plan:   Post-operative Plan:   Informed Consent: I have reviewed the patients History and Physical, chart, labs and discussed the procedure including the risks, benefits and alternatives for the proposed anesthesia with the patient or authorized representative who has indicated his/her understanding and acceptance.     Dental advisory given  Plan Discussed with: CRNA and Surgeon  Anesthesia Plan Comments: (Almost passed out this morning, most likely from dehydration, vital signs stable, will give IV fluids )        Anesthesia Quick Evaluation

## 2022-08-18 LAB — SURGICAL PATHOLOGY

## 2022-08-19 ENCOUNTER — Encounter: Payer: Self-pay | Admitting: Internal Medicine

## 2022-08-25 ENCOUNTER — Encounter (HOSPITAL_COMMUNITY): Payer: Self-pay | Admitting: Internal Medicine

## 2022-08-27 ENCOUNTER — Telehealth: Payer: Self-pay | Admitting: Internal Medicine

## 2022-08-27 NOTE — Telephone Encounter (Signed)
Pt was advised of her results and was told that a letter was mailed out as well. Pt verbalized understanding.

## 2022-08-27 NOTE — Telephone Encounter (Signed)
Pt said she called Tuesday and again today. She is asking about her results. [phone]

## 2022-09-28 NOTE — Progress Notes (Unsigned)
GI Office Note    Referring Provider: Benita Stabile, MD Primary Care Physician:  Benita Stabile, MD Primary Gastroenterologist: Gerrit Friends.Rourk, MD  Date:  09/29/2022  ID:  SHALIYAH TAITE, DOB 1945/12/14, MRN [id number]   Chief Complaint   Chief Complaint  Patient presents with   Follow-up    Feels like she never finish having a BM    History of Present Illness  CHIMENE SALO is a 77 y.o. female with a history of anxiety, osteoporosis, HLD, GERD, gastric polyps, and skin cancer presenting today for follow-up post procedures.  Initial office visit 07/22/2021.  Seen for reflux.  Reportedly on omeprazole 20 mg daily for about 20 years.  Has burping if she eats in bends over.  Having intermittent chest pain after eating.  Some early morning nausea but no vomiting.  Denied NSAIDs.  Denied typical GERD trigger foods.  Requested an EGD to take another look at her esophagus and stomach.  States she usually eats 2 meals per day.  Last EGD about 20 years or more prior performed by Dr. Jarold Motto.  States a history of gastric polyps.  Doing yearly stool testing.  No colonoscopy. Scheduled for EGD but then patient had to cancel given her sister was sick.   Last office visit 07/06/2022. Doing okay with reflux.  Sister diagnosed with colon cancer September 2021 and passed in December 2023.  She was her caregiver.  Complaining of fatigue and some early morning nausea without vomiting.  At times she has to make herself eat, generally has 2 big meals throughout the day.  Denies any shortness of breath or chest pain.  Has reported some intermittent constipation and diarrhea with frequent need to wipe.  Advise reschedule EGD and colonoscopy.  Use MiraLAX daily as needed for constipation and increase fiber supplements.  Continue low-dose PPI once daily and advised increase protein intake to maintain weight.  EGD 08/17/2022: - Normal esophagus.  - Small hiatal hernia.  - Multiple gastric polyps.  - Normal duodenal  bulb and second portion of the duodenum.  - Status post gastric polypectomy and gastric biopsy - Path: Mild reactive gastropathy, negative for H. pylori, polyp revealed to be fundic gland polyp  Colonoscopy 08/17/2022: - One 3 mm polyp in the cecum - Non- bleeding internal hemorrhoids.  - The examination was otherwise normal. - Path: Diminutive tubular adenoma - Future colonoscopy not recommended unless new symptoms develop  Today: GERD - reflux is doing okay. Would like to consider weaning off omeprazole. Appetite has been improved. No N/V, dysphagia.   Incomplete emptying - Sometimes has to go back to the bathroom after her initial trip. Was taking fiber previously and recently started back but felt like she was more incomplete. No abdominal pain. No melena or brbpr.   Wt Readings from Last 3 Encounters:  09/29/22 91 lb 6.4 oz (41.5 kg)  08/17/22 90 lb (40.8 kg)  07/06/22 89 lb 12.8 oz (40.7 kg)   Current Outpatient Medications  Medication Sig Dispense Refill   acetaminophen (TYLENOL) 500 MG tablet Take 500 mg by mouth every 8 (eight) hours as needed for moderate pain.     ALPRAZolam (XANAX) 0.25 MG tablet Take 0.25 mg by mouth daily as needed for anxiety.     omeprazole (PRILOSEC) 20 MG capsule Take 1 capsule (20 mg total) by mouth daily. 30 capsule 1   No current facility-administered medications for this visit.    Past Medical History:  Diagnosis Date  Anxiety    GERD (gastroesophageal reflux disease)     Past Surgical History:  Procedure Laterality Date   BIOPSY  08/17/2022   Procedure: BIOPSY;  Surgeon: Corbin Ade, MD;  [facility];  Service: Endoscopy;;   COLONOSCOPY     COLONOSCOPY WITH PROPOFOL N/A 08/17/2022   Procedure: COLONOSCOPY WITH PROPOFOL;  Surgeon: Corbin Ade, MD;  [facility];  Service: Endoscopy;  Laterality: N/A;  1:15 pm, asa 2   ESOPHAGOGASTRODUODENOSCOPY     ESOPHAGOGASTRODUODENOSCOPY (EGD) WITH PROPOFOL N/A  08/17/2022   Procedure: ESOPHAGOGASTRODUODENOSCOPY (EGD) WITH PROPOFOL;  Surgeon: Corbin Ade, MD;  [facility];  Service: Endoscopy;  Laterality: N/A;   POLYPECTOMY  08/17/2022   Procedure: POLYPECTOMY;  Surgeon: Corbin Ade, MD;  [facility];  Service: Endoscopy;;   TUBAL LIGATION      No family history on file.  Allergies as of 09/29/2022 - Review Complete 09/29/2022  Allergen Reaction Noted   Tetracycline Nausea And Vomiting and Other (See Comments) 07/22/2021    Social History   Socioeconomic History   Marital status: Unknown    Spouse name: Not on file   Number of children: Not on file   Years of education: Not on file   Highest education level: Not on file  Occupational History   Not on file  Tobacco Use   Smoking status: Never   Smokeless tobacco: Never  Vaping Use   Vaping status: Never Used  Substance and Sexual Activity   Alcohol use: Never   Drug use: Never   Sexual activity: Not on file  Other Topics Concern   Not on file  Social History Narrative   Not on file   Social Determinants of Health   Financial Resource Strain: Not on file  Food Insecurity: Not on file  Transportation Needs: Not on file  Physical Activity: Not on file  Stress: Not on file  Social Connections: Not on file   Review of Systems   Gen: Denies fever, chills, anorexia. Denies fatigue, weakness, weight loss.  CV: Denies chest pain, palpitations, syncope, peripheral edema, and claudication. Resp: Denies dyspnea at rest, cough, wheezing, coughing up blood, and pleurisy. GI: See HPI Derm: Denies rash, itching, dry skin Psych: Denies depression, anxiety, memory loss, confusion. No homicidal or suicidal ideation.  Heme: Denies bruising, bleeding, and enlarged lymph nodes.  Physical Exam   BP 117/64 (BP Location: Right Arm, Patient Position: Sitting, Cuff Size: Normal)   Pulse 83   Temp 97.6 F (36.4 C) (Temporal)   Ht 4\' 11"  (1.499 m)   Wt 91 lb  6.4 oz (41.5 kg)   SpO2 98%   BMI 18.46 kg/m   General:   Alert and oriented. No distress noted. Pleasant and cooperative.  Head:  Normocephalic and atraumatic. Eyes:  Conjuctiva clear without scleral icterus. Mouth:  Oral mucosa pink and moist. Good dentition. No lesions. Lungs:  Clear to auscultation bilaterally. No wheezes, rales, or rhonchi. No distress.  Heart:  S1, S2 present without murmurs appreciated.  Abdomen:  +BS, soft, non-tender and non-distended. No rebound or guarding. No HSM or masses noted. Rectal: deferred Msk:  Symmetrical without gross deformities. Normal posture. Extremities:  Without edema. Neurologic:  Alert and  oriented x4 Psych:  Alert and cooperative. Normal mood and affect.  Assessment  JENNAVIE MARTINEK is a 77 y.o. female with a history of anxiety, osteoporosis, HLD, GERD, gastric polyps, and skin cancer presenting today for follow-up.  GERD, history of gastric polyps: Has been well-controlled with omeprazole once daily. Appetite has improved. Weight stable.  At baseline usually eats only 2 bigger meals throughout the day.  EGD last month with small hiatal hernia, and removal of gastric polyp revealed to be a fundic gland polyp and gastric biopsies with mild reactive gastropathy.  No significant symptoms at this time therefore she would like to consider weaning.  We discussed performing a slow wean which she has agreed to.  Have recommended to use famotidine as needed for any breakthrough symptoms and we could possibly transition to this long-term if she does have some mild symptoms.  Constipation: She reports daily soft bowel movements but has been experiencing some incomplete emptying as she commonly will need a second trip to the bathroom a short time after her first one of the day, usually after meal.  Denies any significant straining.  Has tried fiber in the past without good relief, actually states at times this made it worse.  History of colon polyps,  family history of colon cancer in sister: Recent colonoscopy with diminutive tubular adenoma removed.  No repeat colonoscopy recommended due to age.  PLAN   Wean omeprazole, see AVS for instructions Use famotidine as needed for breakthrough Start miralax 1/2 capful daily.  Follow up in 3 months.     Brooke Bonito, MSN, FNP-BC, AGACNP-BC [facility]

## 2022-09-29 ENCOUNTER — Ambulatory Visit (INDEPENDENT_AMBULATORY_CARE_PROVIDER_SITE_OTHER): Payer: Medicare HMO | Admitting: Gastroenterology

## 2022-09-29 ENCOUNTER — Encounter: Payer: Self-pay | Admitting: Gastroenterology

## 2022-09-29 VITALS — BP 117/64 | HR 83 | Temp 97.6°F | Ht 59.0 in | Wt 91.4 lb

## 2022-09-29 DIAGNOSIS — R110 Nausea: Secondary | ICD-10-CM

## 2022-09-29 DIAGNOSIS — K317 Polyp of stomach and duodenum: Secondary | ICD-10-CM

## 2022-09-29 DIAGNOSIS — Z800 Family history of malignant neoplasm of digestive organs: Secondary | ICD-10-CM | POA: Diagnosis not present

## 2022-09-29 DIAGNOSIS — R5383 Other fatigue: Secondary | ICD-10-CM

## 2022-09-29 DIAGNOSIS — K219 Gastro-esophageal reflux disease without esophagitis: Secondary | ICD-10-CM

## 2022-09-29 NOTE — Patient Instructions (Signed)
Start taking your omeprazole every other day and monitor for worsening symptoms.  If you develop breakthrough symptoms you may use Pepcid (famotidine) once daily.  Please do this for 2 weeks.  If after 2 weeks you are doing well with every other day dosing I want you to decrease this to omeprazole once every 3 days and do this for 2 weeks.  You may continue to use Pepcid as needed during that time as well.  If you do well with this then I want you to take omeprazole every 5 days for 1 week and then stop.  It is safe to use Pepcid long-term and if you need this you can certainly use it daily.  If at any point during your weaning you develop severe daily symptoms you may resume your omeprazole daily or every other day.  For your incomplete emptying which you to start taking a half a capful of MiraLAX daily.  You may benefit more from taking this in the evening.  Follow up in 3 months.  It was a pleasure to see you today. I want to create trusting relationships with patients. If you receive a survey regarding your visit,  I greatly appreciate you taking time to fill this out on paper or through your MyChart. I value your feedback.  Brooke Bonito, MSN, FNP-BC, AGACNP-BC [facility]

## 2022-11-19 ENCOUNTER — Encounter: Payer: Self-pay | Admitting: Gastroenterology

## 2022-12-17 ENCOUNTER — Other Ambulatory Visit (HOSPITAL_COMMUNITY): Payer: Self-pay | Admitting: Internal Medicine

## 2022-12-17 DIAGNOSIS — Z1231 Encounter for screening mammogram for malignant neoplasm of breast: Secondary | ICD-10-CM

## 2022-12-21 ENCOUNTER — Telehealth: Payer: Self-pay | Admitting: *Deleted

## 2022-12-21 ENCOUNTER — Other Ambulatory Visit: Payer: Self-pay | Admitting: Gastroenterology

## 2022-12-21 MED ORDER — FAMOTIDINE 20 MG PO TABS: 20.0000 mg | ORAL_TABLET | Freq: Every day | ORAL | 3 refills | Status: DC | PRN

## 2022-12-21 NOTE — Telephone Encounter (Signed)
Pt called and states she needs Pepcid 20mg  sent to [facility]. Pt last OV 09/29/2022

## 2022-12-21 NOTE — Progress Notes (Signed)
Done

## 2022-12-24 ENCOUNTER — Ambulatory Visit (HOSPITAL_COMMUNITY)
Admission: RE | Admit: 2022-12-24 | Discharge: 2022-12-24 | Disposition: A | Discharge status: Home / Self Care | Payer: Medicare HMO | Source: Ambulatory Visit | Attending: Internal Medicine | Admitting: Internal Medicine

## 2022-12-24 DIAGNOSIS — Z1231 Encounter for screening mammogram for malignant neoplasm of breast: Secondary | ICD-10-CM | POA: Diagnosis not present

## 2022-12-28 DIAGNOSIS — H5213 Myopia, bilateral: Secondary | ICD-10-CM | POA: Diagnosis not present

## 2022-12-28 DIAGNOSIS — Z0100 Encounter for examination of eyes and vision without abnormal findings: Secondary | ICD-10-CM | POA: Diagnosis not present

## 2022-12-29 DIAGNOSIS — L820 Inflamed seborrheic keratosis: Secondary | ICD-10-CM | POA: Diagnosis not present

## 2022-12-29 DIAGNOSIS — L570 Actinic keratosis: Secondary | ICD-10-CM | POA: Diagnosis not present

## 2022-12-29 DIAGNOSIS — X32XXXD Exposure to sunlight, subsequent encounter: Secondary | ICD-10-CM | POA: Diagnosis not present

## 2022-12-29 DIAGNOSIS — Z08 Encounter for follow-up examination after completed treatment for malignant neoplasm: Secondary | ICD-10-CM | POA: Diagnosis not present

## 2022-12-29 DIAGNOSIS — Z85828 Personal history of other malignant neoplasm of skin: Secondary | ICD-10-CM | POA: Diagnosis not present

## 2023-01-21 ENCOUNTER — Ambulatory Visit: Payer: Medicare HMO | Admitting: Gastroenterology

## 2023-01-21 ENCOUNTER — Encounter: Payer: Self-pay | Admitting: Gastroenterology

## 2023-01-21 VITALS — BP 138/73 | HR 97 | Temp 97.7°F | Ht 59.0 in | Wt 91.8 lb

## 2023-01-21 DIAGNOSIS — K5900 Constipation, unspecified: Secondary | ICD-10-CM

## 2023-01-21 DIAGNOSIS — K219 Gastro-esophageal reflux disease without esophagitis: Secondary | ICD-10-CM | POA: Diagnosis not present

## 2023-01-21 NOTE — Patient Instructions (Addendum)
Continue famotidine 20 mg once daily.  If you have any severe breakthrough symptoms you can take an additional dose in the evening if needed.  You can stop taking MiraLAX.  Start Benefiber 3 teaspoons (1 tablespoon) once daily for 3 weeks.  After 3 weeks you can increase to twice daily.    Follow up in 4 months.    It was a pleasure to see you today. I want to create trusting relationships with patients. If you receive a survey regarding your visit,  I greatly appreciate you taking time to fill this out on paper or through your MyChart. I value your feedback.  Brooke Bonito, MSN, FNP-BC, AGACNP-BC [facility]

## 2023-01-21 NOTE — Progress Notes (Signed)
GI Office Note    Referring Provider: Benita Stabile, MD Primary Care Physician:  Benita Stabile, MD Primary Gastroenterologist: Gerrit Friends.Rourk, MD  Date:  01/21/2023  ID:  Jasmine Joseph, DOB [birth year], MRN [id number]   Chief Complaint   Chief Complaint  Patient presents with   Follow-up    Follow up. Stools are soft and mushy. Thinks she is taking too much MiraLax.    History of Present Illness  Jasmine Joseph is a 77 y.o. female with a history of constipation, osteoporosis, anxiety, GERD, HLD, gastric polyps, and skin cancer presenting today for follow-up with complaints of soft/mushy stool.  Initial office visit 07/22/2021.  Seen for reflux.  Reportedly on omeprazole 20 mg daily for about 20 years.  Has burping if she eats in bends over.  Having intermittent chest pain after eating.  Some early morning nausea but no vomiting.  Denied NSAIDs.  Denied typical GERD trigger foods.  Requested an EGD to take another look at her esophagus and stomach.  States she usually eats 2 meals per day.  Last EGD about 20 years or more prior performed by Dr. Jarold Motto.  States a history of gastric polyps.  Doing yearly stool testing.  No colonoscopy. Scheduled for EGD but then patient had to cancel given her sister was sick.    Office visit 07/06/2022. Doing okay with reflux.  Sister diagnosed with colon cancer September 2021 and passed in December 2023.  She was her caregiver.  Complaining of fatigue and some early morning nausea without vomiting.  At times she has to make herself eat, generally has 2 big meals throughout the day.  Denies any shortness of breath or chest pain.  Has reported some intermittent constipation and diarrhea with frequent need to wipe.  Advise reschedule EGD and colonoscopy.  Use MiraLAX daily as needed for constipation and increase fiber supplements.  Continue low-dose PPI once daily and advised increase protein intake to maintain weight.   EGD 08/17/2022: - Normal esophagus.  -  Small hiatal hernia.  - Multiple gastric polyps.  - Normal duodenal bulb and second portion of the duodenum.  - Status post gastric polypectomy and gastric biopsy - Path: Mild reactive gastropathy, negative for H. pylori, polyp revealed to be fundic gland polyp   Colonoscopy 08/17/2022: - One 3 mm polyp in the cecum - Non- bleeding internal hemorrhoids.  - The examination was otherwise normal. - Path: Diminutive tubular adenoma - Future colonoscopy not recommended unless new symptoms develop  Last office visit 09/29/2022.  Reflux doing okay, would like to consider weaning off omeprazole.  Appetite improved and denied any nausea, vomiting, or dysphagia.  Having some incomplete emptying and some times has to go back after bowel movements.  Taking fiber previously and had recently started back but felt like still more incomplete.  No GI bleeding or abdominal pain. Advised wean of omeprazole to every other day and to use Pepcid as needed.  Advised to start MiraLAX half capful daily for constipation.   Today: GERD - With pepcid 20 mg has been doing okay. No vomiting. Occasional nausea very short lasting. No dysphagia. Appetite is "pretty good". Has a better appetite in the evenings. Will eat protein bars and fruits.   Constipation - briefly stopped miralax but since restarting back she still feels like she has incomplete emptying. Before the colonoscopy she tried the benefiber. She does not think the miralax is doing the job. Strains sometimes but not often. Some days are  okay. She will eat cheerios or special K or bananas for breakfast. Interested in trying fiber again. She knows she needs to drink.    Wt Readings from Last 3 Encounters:  01/21/23 91 lb 12.8 oz (41.6 kg)  09/29/22 91 lb 6.4 oz (41.5 kg)  08/17/22 90 lb (40.8 kg)  07/06/22 89 lb  Current Outpatient Medications  Medication Sig Dispense Refill   acetaminophen (TYLENOL) 500 MG tablet Take 500 mg by mouth every 8 (eight) hours as  needed for moderate pain.     ALPRAZolam (XANAX) 0.25 MG tablet Take 0.25 mg by mouth daily as needed for anxiety.     famotidine (PEPCID) 20 MG tablet Take 1 tablet (20 mg total) by mouth daily as needed for heartburn or indigestion. 30 tablet 3   omeprazole (PRILOSEC) 20 MG capsule Take 1 capsule (20 mg total) by mouth daily. (Patient not taking: Reported on 01/21/2023) 30 capsule 1   No current facility-administered medications for this visit.    Past Medical History:  Diagnosis Date   Anxiety    GERD (gastroesophageal reflux disease)     Past Surgical History:  Procedure Laterality Date   BIOPSY  08/17/2022   Procedure: BIOPSY;  Surgeon: Corbin Ade, MD;  [facility];  Service: Endoscopy;;   COLONOSCOPY     COLONOSCOPY WITH PROPOFOL N/A 08/17/2022   Procedure: COLONOSCOPY WITH PROPOFOL;  Surgeon: Corbin Ade, MD;  [facility];  Service: Endoscopy;  Laterality: N/A;  1:15 pm, asa 2   ESOPHAGOGASTRODUODENOSCOPY     ESOPHAGOGASTRODUODENOSCOPY (EGD) WITH PROPOFOL N/A 08/17/2022   Procedure: ESOPHAGOGASTRODUODENOSCOPY (EGD) WITH PROPOFOL;  Surgeon: Corbin Ade, MD;  [facility];  Service: Endoscopy;  Laterality: N/A;   POLYPECTOMY  08/17/2022   Procedure: POLYPECTOMY;  Surgeon: Corbin Ade, MD;  [facility];  Service: Endoscopy;;   TUBAL LIGATION      No family history on file.  Allergies as of 01/21/2023 - Review Complete 01/21/2023  Allergen Reaction Noted   Tetracycline Nausea And Vomiting and Other (See Comments) 07/22/2021    Social History   Socioeconomic History   Marital status: Unknown    Spouse name: Not on file   Number of children: Not on file   Years of education: Not on file   Highest education level: Not on file  Occupational History   Not on file  Tobacco Use   Smoking status: Never   Smokeless tobacco: Never  Vaping Use   Vaping status: Never Used  Substance and Sexual Activity   Alcohol  use: Never   Drug use: Never   Sexual activity: Not on file  Other Topics Concern   Not on file  Social History Narrative   Not on file   Social Determinants of Health   Financial Resource Strain: Not on file  Food Insecurity: Not on file  Transportation Needs: Not on file  Physical Activity: Not on file  Stress: Not on file  Social Connections: Not on file     Review of Systems   Gen: Denies fever, chills, anorexia. Denies fatigue, weakness, weight loss.  CV: Denies chest pain, palpitations, syncope, peripheral edema, and claudication. Resp: Denies dyspnea at rest, cough, wheezing, coughing up blood, and pleurisy. GI: See HPI Derm: Denies rash, itching, dry skin Psych: Denies depression, anxiety, memory loss, confusion. No homicidal or suicidal ideation.  Heme: Denies bruising, bleeding, and enlarged lymph nodes.  Physical Exam   BP 138/73 (BP  Location: Right Arm, Patient Position: Sitting, Cuff Size: Normal)   Pulse 97   Temp 97.7 F (36.5 C) (Temporal)   Ht 4\' 11"  (1.499 m)   Wt 91 lb 12.8 oz (41.6 kg)   BMI 18.54 kg/m   General:   Alert and oriented. No distress noted. Pleasant and cooperative.  Head:  Normocephalic and atraumatic. Eyes:  Conjuctiva clear without scleral icterus. Mouth:  Oral mucosa pink and moist. Good dentition. No lesions. Abdomen:  +BS, soft, non-tender and non-distended. No rebound or guarding. No HSM or masses noted. Rectal: deferred Msk:  Symmetrical without gross deformities. Normal posture. Extremities:  Without edema. Neurologic:  Alert and  oriented x4 Psych:  Alert and cooperative. Normal mood and affect.  Assessment  BREONICA GAUNA is a 77 y.o. female with a history of constipation, osteoporosis, anxiety, GERD, HLD, gastric polyps, and skin cancer presenting today for follow-up with complaints of soft/mushy stool.  Constipation: Still having some incomplete emptying and having more mushy stools.  Felt like she was doing better  while she was on fiber supplementation and thinks that maybe if she increases her fiber supplementation this may improve.  I have agreed and advised her to: Stop MiraLAX and she will start Benefiber and will slowly increase up to twice daily.  Denies any bloating, melena, or BRBPR.  GERD: Fairly well-controlled with famotidine 20 mg once daily.  Advised that if she has any severe breakthrough symptoms she can take an additional dose as needed.  PLAN   Continue famotidine 20 mg once daily as needed. Increase to twice daily as needed. Stop miralax Start benefiber once daily for 3 weeks then increase to twice daily.  Continue high-fiber diet Follow up in 6 months.    Brooke Bonito, MSN, FNP-BC, AGACNP-BC [facility]

## 2023-05-04 ENCOUNTER — Encounter: Payer: Self-pay | Admitting: Gastroenterology

## 2023-05-24 ENCOUNTER — Other Ambulatory Visit: Payer: Self-pay | Admitting: Gastroenterology

## 2023-05-24 DIAGNOSIS — R7303 Prediabetes: Secondary | ICD-10-CM | POA: Diagnosis not present

## 2023-05-24 DIAGNOSIS — E782 Mixed hyperlipidemia: Secondary | ICD-10-CM | POA: Diagnosis not present

## 2023-05-31 ENCOUNTER — Other Ambulatory Visit (HOSPITAL_COMMUNITY): Payer: Self-pay | Admitting: Nurse Practitioner

## 2023-05-31 DIAGNOSIS — E782 Mixed hyperlipidemia: Secondary | ICD-10-CM | POA: Diagnosis not present

## 2023-05-31 DIAGNOSIS — K219 Gastro-esophageal reflux disease without esophagitis: Secondary | ICD-10-CM | POA: Diagnosis not present

## 2023-05-31 DIAGNOSIS — M810 Age-related osteoporosis without current pathological fracture: Secondary | ICD-10-CM

## 2023-05-31 DIAGNOSIS — Z0000 Encounter for general adult medical examination without abnormal findings: Secondary | ICD-10-CM | POA: Diagnosis not present

## 2023-05-31 DIAGNOSIS — R7303 Prediabetes: Secondary | ICD-10-CM | POA: Diagnosis not present

## 2023-05-31 DIAGNOSIS — F411 Generalized anxiety disorder: Secondary | ICD-10-CM | POA: Diagnosis not present

## 2023-05-31 DIAGNOSIS — E559 Vitamin D deficiency, unspecified: Secondary | ICD-10-CM | POA: Diagnosis not present

## 2023-05-31 DIAGNOSIS — Z0001 Encounter for general adult medical examination with abnormal findings: Secondary | ICD-10-CM | POA: Diagnosis not present

## 2023-05-31 DIAGNOSIS — Z681 Body mass index (BMI) 19 or less, adult: Secondary | ICD-10-CM | POA: Diagnosis not present

## 2023-06-08 ENCOUNTER — Ambulatory Visit (HOSPITAL_COMMUNITY)
Admission: RE | Admit: 2023-06-08 | Discharge: 2023-06-08 | Disposition: A | Discharge status: Home / Self Care | Source: Ambulatory Visit | Attending: Nurse Practitioner | Admitting: Nurse Practitioner

## 2023-06-08 DIAGNOSIS — Z780 Asymptomatic menopausal state: Secondary | ICD-10-CM | POA: Diagnosis not present

## 2023-06-08 DIAGNOSIS — M810 Age-related osteoporosis without current pathological fracture: Secondary | ICD-10-CM | POA: Insufficient documentation

## 2023-07-09 ENCOUNTER — Telehealth: Payer: Self-pay | Admitting: Gastroenterology

## 2023-07-09 NOTE — Telephone Encounter (Signed)
 pt left voicemail she recieved a letter from us  for a follow up visit  pt states she is not having no issues right now and doesnt need a follow up visit

## 2023-07-27 DIAGNOSIS — X32XXXD Exposure to sunlight, subsequent encounter: Secondary | ICD-10-CM | POA: Diagnosis not present

## 2023-07-27 DIAGNOSIS — Z08 Encounter for follow-up examination after completed treatment for malignant neoplasm: Secondary | ICD-10-CM | POA: Diagnosis not present

## 2023-07-27 DIAGNOSIS — Z85828 Personal history of other malignant neoplasm of skin: Secondary | ICD-10-CM | POA: Diagnosis not present

## 2023-07-27 DIAGNOSIS — L570 Actinic keratosis: Secondary | ICD-10-CM | POA: Diagnosis not present

## 2023-08-11 DIAGNOSIS — T63301A Toxic effect of unspecified spider venom, accidental (unintentional), initial encounter: Secondary | ICD-10-CM | POA: Diagnosis not present

## 2023-11-16 DIAGNOSIS — X32XXXD Exposure to sunlight, subsequent encounter: Secondary | ICD-10-CM | POA: Diagnosis not present

## 2023-11-16 DIAGNOSIS — L570 Actinic keratosis: Secondary | ICD-10-CM | POA: Diagnosis not present

## 2023-11-30 DIAGNOSIS — E782 Mixed hyperlipidemia: Secondary | ICD-10-CM | POA: Diagnosis not present

## 2023-11-30 DIAGNOSIS — E559 Vitamin D deficiency, unspecified: Secondary | ICD-10-CM | POA: Diagnosis not present

## 2023-11-30 DIAGNOSIS — R7303 Prediabetes: Secondary | ICD-10-CM | POA: Diagnosis not present

## 2023-12-07 DIAGNOSIS — K219 Gastro-esophageal reflux disease without esophagitis: Secondary | ICD-10-CM | POA: Diagnosis not present

## 2023-12-07 DIAGNOSIS — E782 Mixed hyperlipidemia: Secondary | ICD-10-CM | POA: Diagnosis not present

## 2023-12-07 DIAGNOSIS — Z23 Encounter for immunization: Secondary | ICD-10-CM | POA: Diagnosis not present

## 2023-12-07 DIAGNOSIS — E8809 Other disorders of plasma-protein metabolism, not elsewhere classified: Secondary | ICD-10-CM | POA: Diagnosis not present

## 2023-12-07 DIAGNOSIS — M810 Age-related osteoporosis without current pathological fracture: Secondary | ICD-10-CM | POA: Diagnosis not present

## 2023-12-07 DIAGNOSIS — F411 Generalized anxiety disorder: Secondary | ICD-10-CM | POA: Diagnosis not present

## 2023-12-07 DIAGNOSIS — E559 Vitamin D deficiency, unspecified: Secondary | ICD-10-CM | POA: Diagnosis not present

## 2023-12-07 DIAGNOSIS — R7303 Prediabetes: Secondary | ICD-10-CM | POA: Diagnosis not present

## 2024-01-18 DIAGNOSIS — L570 Actinic keratosis: Secondary | ICD-10-CM | POA: Diagnosis not present

## 2024-01-18 DIAGNOSIS — X32XXXD Exposure to sunlight, subsequent encounter: Secondary | ICD-10-CM | POA: Diagnosis not present

## 2024-01-18 DIAGNOSIS — C4441 Basal cell carcinoma of skin of scalp and neck: Secondary | ICD-10-CM | POA: Diagnosis not present
